# Patient Record
Sex: Male | Born: 1985
Health system: Southern US, Community
[De-identification: ages and names within clinical notes are randomized; demographics above are authoritative.]

---

## 2012-01-20 ENCOUNTER — Telehealth: Payer: Self-pay | Admitting: Pulmonary Disease

## 2012-01-20 NOTE — Telephone Encounter (Signed)
Per SN---yes.  thanks

## 2012-01-20 NOTE — Telephone Encounter (Signed)
Please advise if SN wants to accept this pt or not. Thanks!

## 2012-01-20 NOTE — Telephone Encounter (Signed)
Per SN---which Ms. Zachary Bond?   Yes ok to schedule pt for CPX for the next avaliable.  thanks

## 2012-01-20 NOTE — Telephone Encounter (Signed)
[email or web address]. SN next available August 27th.Charnise Lovan Yancey [place], [place]

## 2012-01-20 NOTE — Telephone Encounter (Signed)
Sorry, Zachary Bond. Still ok?

## 2012-01-21 NOTE — Telephone Encounter (Signed)
Zachary Bond RETURNED CALL. [name] IS SCHEDULED FOR PHYSICAL ON 03-09-12. NOTHING FURTHER NEEDED. [name]

## 2012-01-21 NOTE — Telephone Encounter (Signed)
[email or web address]  

## 2012-03-09 ENCOUNTER — Ambulatory Visit (INDEPENDENT_AMBULATORY_CARE_PROVIDER_SITE_OTHER): Payer: 59 | Admitting: Pulmonary Disease

## 2012-03-09 ENCOUNTER — Ambulatory Visit (INDEPENDENT_AMBULATORY_CARE_PROVIDER_SITE_OTHER)
Admission: RE | Admit: 2012-03-09 | Discharge: 2012-03-09 | Disposition: A | Discharge status: Home / Self Care | Payer: 59 | Source: Ambulatory Visit | Attending: Pulmonary Disease | Admitting: Pulmonary Disease

## 2012-03-09 ENCOUNTER — Encounter: Payer: Self-pay | Admitting: Pulmonary Disease

## 2012-03-09 ENCOUNTER — Other Ambulatory Visit (INDEPENDENT_AMBULATORY_CARE_PROVIDER_SITE_OTHER): Payer: 59

## 2012-03-09 VITALS — BP 110/84 | HR 78 | Temp 98.2°F | Ht 64.0 in | Wt 188.0 lb

## 2012-03-09 DIAGNOSIS — Z23 Encounter for immunization: Secondary | ICD-10-CM

## 2012-03-09 DIAGNOSIS — Z0000 Encounter for general adult medical examination without abnormal findings: Secondary | ICD-10-CM

## 2012-03-09 LAB — HEPATIC FUNCTION PANEL
AST: 24 U/L (ref 0–37)
Alkaline Phosphatase: 42 U/L (ref 39–117)
Bilirubin, Direct: 0.2 mg/dL (ref 0.0–0.3)
Total Bilirubin: 1.2 mg/dL (ref 0.3–1.2)

## 2012-03-09 LAB — URINALYSIS
Ketones, ur: NEGATIVE
Specific Gravity, Urine: 1.015 (ref 1.000–1.030)
Total Protein, Urine: NEGATIVE
Urine Glucose: NEGATIVE
pH: 7 (ref 5.0–8.0)

## 2012-03-09 LAB — BASIC METABOLIC PANEL
CO2: 27 mEq/L (ref 19–32)
Chloride: 105 mEq/L (ref 96–112)
Glucose, Bld: 73 mg/dL (ref 70–99)
Sodium: 139 mEq/L (ref 135–145)

## 2012-03-09 LAB — LIPID PANEL: Total CHOL/HDL Ratio: 3

## 2012-03-09 NOTE — Patient Instructions (Addendum)
Zachary Bond, it was great meeting you today...  Your medical history is certainly negative, and your exam was essentially normal...  Today we did a baseline CXR, EKG, & FASTING blood work...    We will call you tomorrow w/ the results...  Call anytime for any questions or concerns...    Assuming that your baseline data is also normal, I would suggest a recheck physical in 2-3 yrs.Marland KitchenMarland Kitchen

## 2012-03-09 NOTE — Progress Notes (Signed)
Subjective:     Patient ID: Zachary Bond, male   DOB: 04/11/1986, 26 y.o.   MRN: [id number]  HPI 26 y/o BM, fiancee of Zachary Bond (grand daughter of Zachary Bond), here to establish as a new pt w/ CPX...  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          PROBLEM LIST:   DYSLIPIDEMIA >> Initial labs here 9/13 showed LDL= 120 7 rec for low chol diet... ~  FLP 9/13 on diet alone showed TChol 186, TG 51, HDL 56, LDL 120   No past medical history on file.  No past surgical history on file.  No outpatient encounter prescriptions on file as of 03/09/2012.   No Known Allergies  No family history on file. Father- Alive, age 73, hx HBP Mother- Alive, age 42, good general health Siblings- one sister, younger, good general health   History   Social History  . Marital Status: Engaged    Spouse Name: Zachary Bond    Number of Children: Zachary has 2 children  . Years of Education: Grad [employer] w/ Marketing degree   Occupational History  . Solicitor    Social History Main Topics  . Smoking status: Former Games developer  . Smokeless tobacco: Not on file   Comment: smoked a few times  . Alcohol Use: Yes     social use  . Drug Use: Not on file  . Sexually Active: Not on file   Other Topics Concern  . Not on file   Social History Narrative  . No narrative on file     Current Medications, Allergies, Past Medical History, Past Surgical History, Family History, and Social History were reviewed in [facility] record.   Review of  Systems    Constitutional:  Denies F/C/S, anorexia, unexpected weight change. HEENT:  No HA, visual changes, earache, nasal symptoms, sore throat, hoarseness. Resp:  No cough, sputum, hemoptysis; no SOB, tightness, wheezing. Cardio:  No CP, palpit, DOE, orthopnea, edema. GI:  Denies N/V/D/C or blood in stool; no reflux, abd pain, distention, or gas. GU:  No dysuria, freq, urgency, hematuria, or flank pain. MS:  Denies joint pain, swelling, tenderness, or decr ROM; no neck pain, back pain, etc. Neuro:  No tremors, seizures, dizziness, syncope, weakness, numbness, gait abn. Skin:  No suspicious lesions or skin rash. Heme:  No adenopathy, bruising, bleeding. Psyche: Denies confusion, sleep disturbance, hallucinations, anxiety, depression.   Objective:   Physical Exam    Vital Signs:  Reviewed...  General:  WD, WN, 26 y/o BM in NAD; alert & oriented; pleasant & cooperative... HEENT:  [place]/AT; Conjunctiva- pink, Sclera- nonicteric, EOM-wnl, PERRLA, Fundi-benign; EACs-clear, TMs-wnl; NOSE-clear; THROAT-clear & wnl. Neck:  Supple w/ full ROM; no JVD; normal carotid impulses w/o bruits; no thyromegaly or nodules palpated; no lymphadenopathy. Chest:  Clear to P & A; without wheezes, rales, or rhonchi heard. Heart:  Regular Rhythm; norm S1 & S2 without murmurs, rubs, or gallops detected. Abdomen:  Soft & nontender- no guarding or  rebound; normal bowel sounds; no organomegaly or masses palpated. Ext:  Normal ROM; without deformities or arthritic changes; no varicose veins, venous insuffic, or edema;  Pulses intact w/o bruits. Neuro:  CNs II-XII intact; motor testing normal; sensory testing normal; gait normal & balance OK. Derm:  No lesions noted; no rash etc. Lymph:  No cervical, supraclavicular, axillary, or inguinal adenopathy palpated.  RADIOLOGY DATA:  Reviewed in the EPIC EMR & discussed w/ the patient...  LABORATORY DATA:  Reviewed in the EPIC EMR & discussed w/ the  patient...   Assessment:     CPX >>  Godd general medical health...  Dyslipidemia>  LDL=120 & rec to get on better low chol/ low fat diet...     Plan:     Patient's Medications   No medications on file

## 2013-05-24 ENCOUNTER — Encounter: Payer: 59 | Admitting: Pulmonary Disease

## 2013-06-14 ENCOUNTER — Ambulatory Visit (INDEPENDENT_AMBULATORY_CARE_PROVIDER_SITE_OTHER): Payer: 59 | Admitting: Pulmonary Disease

## 2013-06-14 ENCOUNTER — Encounter: Payer: Self-pay | Admitting: Pulmonary Disease

## 2013-06-14 ENCOUNTER — Other Ambulatory Visit (INDEPENDENT_AMBULATORY_CARE_PROVIDER_SITE_OTHER): Payer: 59

## 2013-06-14 VITALS — BP 110/76 | HR 82 | Temp 98.6°F | Ht 73.0 in | Wt 191.6 lb

## 2013-06-14 DIAGNOSIS — K1370 Unspecified lesions of oral mucosa: Secondary | ICD-10-CM

## 2013-06-14 DIAGNOSIS — Z0000 Encounter for general adult medical examination without abnormal findings: Secondary | ICD-10-CM

## 2013-06-14 DIAGNOSIS — K1379 Other lesions of oral mucosa: Secondary | ICD-10-CM

## 2013-06-14 DIAGNOSIS — Z23 Encounter for immunization: Secondary | ICD-10-CM

## 2013-06-14 DIAGNOSIS — Z3009 Encounter for other general counseling and advice on contraception: Secondary | ICD-10-CM

## 2013-06-14 LAB — CBC WITH DIFFERENTIAL/PLATELET
Basophils Absolute: 0 10*3/uL (ref 0.0–0.1)
Basophils Relative: 0.3 % (ref 0.0–3.0)
Eosinophils Absolute: 0.5 10*3/uL (ref 0.0–0.7)
Hemoglobin: 16.2 g/dL (ref 13.0–17.0)
Lymphocytes Relative: 19.9 % (ref 12.0–46.0)
MCHC: 33.6 g/dL (ref 30.0–36.0)
Monocytes Relative: 8.5 % (ref 3.0–12.0)
Neutro Abs: 4.1 10*3/uL (ref 1.4–7.7)
Neutrophils Relative %: 64.1 % (ref 43.0–77.0)
Platelets: 350 10*3/uL (ref 150.0–400.0)
RDW: 14.2 % (ref 11.5–14.6)
WBC: 6.5 10*3/uL (ref 4.5–10.5)

## 2013-06-14 LAB — BASIC METABOLIC PANEL
BUN: 17 mg/dL (ref 6–23)
CO2: 27 mEq/L (ref 19–32)
Calcium: 9.6 mg/dL (ref 8.4–10.5)
Chloride: 104 mEq/L (ref 96–112)
Creatinine, Ser: 1.3 mg/dL (ref 0.4–1.5)
Glucose, Bld: 86 mg/dL (ref 70–99)
Sodium: 139 mEq/L (ref 135–145)

## 2013-06-14 LAB — TSH: TSH: 0.83 u[IU]/mL (ref 0.35–5.50)

## 2013-06-14 LAB — LIPID PANEL
HDL: 52.2 mg/dL (ref >39.00)
Triglycerides: 55 mg/dL (ref 0.0–149.0)
VLDL: 11 mg/dL (ref 0.0–40.0)

## 2013-06-14 LAB — HEPATIC FUNCTION PANEL
ALT: 18 U/L (ref 0–53)
Albumin: 4.7 g/dL (ref 3.5–5.2)
Alkaline Phosphatase: 50 U/L (ref 39–117)
Bilirubin, Direct: 0.1 mg/dL (ref 0.0–0.3)
Total Protein: 8.7 g/dL — ABNORMAL HIGH (ref 6.0–8.3)

## 2013-06-14 NOTE — Patient Instructions (Signed)
Today we updated your med list in our EPIC system...     Today we did your follow up FASTING blood work...    We will contact you w/ the results when available...   We gave you the 2014 Flu vaccine...  We will arrange for ORAL SURGERY consult regarding your wisdom teeth...    And a UROLOGY consult regarding a vasectomy...  Call for any questions...  Let's plan a follow up visit in 16yr, sooner if needed for problems.Marland KitchenMarland Kitchen

## 2013-06-14 NOTE — Progress Notes (Signed)
Subjective:     Patient ID: Zachary Bond, male   DOB: 1986/04/19, 27 y.o.   MRN: [id number]  HPI 27 y/o BM, fiancee of Allice Jones (grand daughter of Casimer Lanius), here to establish as a new pt w/ CPX...  ~  March 09, 2012:  New pt CPX> Zachary Bond has enjoyed excellent general medical health & has no complaints or concerns;  Zachary Bond was an athlete in HS- football & track (ran the 156m in 9.8);  No signif past medical illnesses other than minor colds/ URIs etc;  No prev surgeries either;  We discussed establishing a good baseline w/ CXR (normal heart size, clear lungs, NAD);  EKG (NSR, rate69, short PR=118, early repol);  FASTING blood work (all wnl x LDL=120)...  ~  June 14, 2013:  64mo ROV & CPX> Zachary Bond & Allice have 3 children (2boys, 1 girl) & he is asking about poss vasectomy- we will refer to Urology... Zachary Bond's CC today is some discomfort in his mouth left side lower jaw & exam shows a poorly erupted 3rd molar, sl tender on palp & we will refer to Dentist/ oral surg for their eval, XRays & prob surg...      He is not on any regular meds, trying to follow low fat diet, but wt is up 4# to 192# (BMI=33) and we reviewed diet, exercise, wt reduction strategies... We reviewed prob list, meds, xrays and labs> see below for updates >> OK Flu vaccine today... LABS 12/14:  FLP- ok on diet x LDL=135;  Chems- wnl;  CBC- wnl;  TSH=0.83...            PROBLEM LIST:   DYSLIPIDEMIA >> Initial labs here 9/13 showed LDL= 120 & rec for low chol diet... ~  FLP 9/13 on diet alone showed TChol 186, TG 51, HDL 56, LDL 120 ~  FLP 12/14 on diet alone showed TChol 198, TG 55, HDL 52, LDL 135... Needs better low chol/ low fat diet...   No past medical history on file.  No past surgical history on file.  No outpatient encounter prescriptions on file as of 06/14/2013.   No Known Allergies  No family history on file. Father- Alive, age 80, hx HBP Mother- Alive, age 53, good general health Siblings- one  sister, younger, good general health   History   Social History  . Marital Status: Married    Spouse Name: Shiloh Lions    Number of Children:  3  . Years of Education: Grad [employer] w/ Marketing degree   Occupational History  . Solicitor    Social History Main Topics  . Smoking status: Former Games developer  . Smokeless tobacco: Not on file   Comment: smoked a few times  . Alcohol Use: Yes     social use  . Drug Use: Not on file  . Sexually Active: Not on file   Other Topics Concern  . Not on file   Social History Narrative  . No narrative on file     Current Medications, Allergies, Past Medical History, Past Surgical History, Family History, and Social History were reviewed in [facility] record.   Review of Systems    Constitutional:  Denies F/C/S, anorexia, unexpected weight change. HEENT:  No HA, visual changes, earache, nasal symptoms, sore throat, hoarseness. Resp:  No cough, sputum, hemoptysis; no SOB, tightness, wheezing. Cardio:  No CP, palpit, DOE, orthopnea, edema. GI:  Denies N/V/D/C or blood in stool; no reflux, abd pain,  distention, or gas. GU:  No dysuria, freq, urgency, hematuria, or flank pain. MS:  Denies joint pain, swelling, tenderness, or decr ROM; no neck pain, back pain, etc. Neuro:  No tremors, seizures, dizziness, syncope, weakness, numbness, gait abn. Skin:  No suspicious lesions or skin rash. Heme:  No adenopathy, bruising, bleeding. Psyche: Denies confusion, sleep disturbance, hallucinations, anxiety, depression.   Objective:   Physical Exam    Vital Signs:  Reviewed...  General:  WD, WN, 27 y/o BM in NAD; alert & oriented; pleasant & cooperative... HEENT:  /AT; Conjunctiva- pink, Sclera- nonicteric, EOM-wnl, PERRLA, Fundi-benign; EACs-clear, TMs-wnl; NOSE-clear; THROAT-clear & wnl. Neck:  Supple w/ full ROM; no JVD; normal carotid impulses w/o bruits; no thyromegaly or nodules palpated; no  lymphadenopathy. Chest:  Clear to P & A; without wheezes, rales, or rhonchi heard. Heart:  Regular Rhythm; norm S1 & S2 without murmurs, rubs, or gallops detected. Abdomen:  Soft & nontender- no guarding or rebound; normal bowel sounds; no organomegaly or masses palpated. Ext:  Normal ROM; without deformities or arthritic changes; no varicose veins, venous insuffic, or edema;  Pulses intact w/o bruits. Neuro:  CNs II-XII intact; motor testing normal; sensory testing normal; gait normal & balance OK. Derm:  No lesions noted; no rash etc. Lymph:  No cervical, supraclavicular, axillary, or inguinal adenopathy palpated.  RADIOLOGY DATA:  Reviewed in the EPIC EMR & discussed w/ the patient...  LABORATORY DATA:  Reviewed in the EPIC EMR & discussed w/ the patient...   Assessment:     CPX >>  Godd general medical health...  ?Impacted wisdom teeth> needs oral surg eval & we will refer...  Dyslipidemia>  LDL=135 & rec to get on better low chol/ low fat diet...  Pt requests Vasectomy> we will refer to Urology...     Plan:     Patient's Medications   No medications on file

## 2013-06-15 LAB — STD PANEL
HIV: NONREACTIVE
Hepatitis B Surface Ag: NEGATIVE

## 2013-07-12 ENCOUNTER — Telehealth: Payer: Self-pay | Admitting: Pulmonary Disease

## 2013-07-12 NOTE — Telephone Encounter (Signed)
Spoke with pt and informed of lab results per Dr Kriste BasqueNadel

## 2014-06-13 ENCOUNTER — Ambulatory Visit: Payer: 59 | Admitting: Pulmonary Disease

## 2015-01-22 ENCOUNTER — Telehealth: Payer: Self-pay | Admitting: Behavioral Health

## 2015-01-22 NOTE — Telephone Encounter (Signed)
Unable to reach patient at time of Pre-Visit Call.  Left message for patient to return call when available.    

## 2015-01-23 ENCOUNTER — Ambulatory Visit: Payer: Self-pay | Admitting: Family Medicine

## 2015-01-23 DIAGNOSIS — Z0289 Encounter for other administrative examinations: Secondary | ICD-10-CM

## 2015-01-24 ENCOUNTER — Telehealth: Payer: Self-pay | Admitting: Pulmonary Disease

## 2015-01-24 ENCOUNTER — Encounter: Payer: Self-pay | Admitting: Pulmonary Disease

## 2015-01-24 NOTE — Telephone Encounter (Signed)
Patient no show new patient appointment 01/23/15 letter mailed, charge or no charge

## 2015-01-25 NOTE — Telephone Encounter (Signed)
charge 

## 2015-10-28 ENCOUNTER — Telehealth: Payer: Self-pay | Admitting: Internal Medicine

## 2015-10-28 NOTE — Telephone Encounter (Signed)
4.25.17 Pt's mother in law Richardo Hanks(Janice Jones) came in to ask if Dr. Posey ReaPlotnikov would take Casimiro NeedleMichael on as a new pt. MS

## 2015-10-30 NOTE — Telephone Encounter (Signed)
Ok Thx 

## 2015-12-11 ENCOUNTER — Other Ambulatory Visit (INDEPENDENT_AMBULATORY_CARE_PROVIDER_SITE_OTHER): Payer: Managed Care, Other (non HMO)

## 2015-12-11 ENCOUNTER — Ambulatory Visit (INDEPENDENT_AMBULATORY_CARE_PROVIDER_SITE_OTHER): Payer: Managed Care, Other (non HMO) | Admitting: Internal Medicine

## 2015-12-11 ENCOUNTER — Encounter: Payer: Self-pay | Admitting: Internal Medicine

## 2015-12-11 VITALS — BP 120/70 | HR 88 | Ht 73.0 in | Wt 203.0 lb

## 2015-12-11 DIAGNOSIS — Z0000 Encounter for general adult medical examination without abnormal findings: Secondary | ICD-10-CM

## 2015-12-11 DIAGNOSIS — R21 Rash and other nonspecific skin eruption: Secondary | ICD-10-CM

## 2015-12-11 DIAGNOSIS — R519 Headache, unspecified: Secondary | ICD-10-CM

## 2015-12-11 DIAGNOSIS — Z23 Encounter for immunization: Secondary | ICD-10-CM

## 2015-12-11 DIAGNOSIS — R51 Headache: Secondary | ICD-10-CM

## 2015-12-11 LAB — CBC WITH DIFFERENTIAL/PLATELET
BASOS ABS: 0 10*3/uL (ref 0.0–0.1)
Basophils Relative: 0.4 % (ref 0.0–3.0)
EOS ABS: 0.5 10*3/uL (ref 0.0–0.7)
Eosinophils Relative: 6.4 % — ABNORMAL HIGH (ref 0.0–5.0)
HCT: 48.3 % (ref 39.0–52.0)
Hemoglobin: 16 g/dL (ref 13.0–17.0)
LYMPHS ABS: 1.5 10*3/uL (ref 0.7–4.0)
Lymphocytes Relative: 20.7 % (ref 12.0–46.0)
MCHC: 33.1 g/dL (ref 30.0–36.0)
MCV: 79.9 fl (ref 78.0–100.0)
MONO ABS: 0.8 10*3/uL (ref 0.1–1.0)
Monocytes Relative: 10.6 % (ref 3.0–12.0)
NEUTROS PCT: 61.9 % (ref 43.0–77.0)
Neutro Abs: 4.5 10*3/uL (ref 1.4–7.7)
Platelets: 374 10*3/uL (ref 150.0–400.0)
RBC: 6.04 Mil/uL — AB (ref 4.22–5.81)
RDW: 14.9 % (ref 11.5–15.5)
WBC: 7.3 10*3/uL (ref 4.0–10.5)

## 2015-12-11 LAB — URINALYSIS
BILIRUBIN URINE: NEGATIVE
Hgb urine dipstick: NEGATIVE
KETONES UR: NEGATIVE
Leukocytes, UA: NEGATIVE
Nitrite: NEGATIVE
PH: 7.5 (ref 5.0–8.0)
SPECIFIC GRAVITY, URINE: 1.01 (ref 1.000–1.030)
Total Protein, Urine: NEGATIVE
URINE GLUCOSE: NEGATIVE
Urobilinogen, UA: 0.2 (ref 0.0–1.0)

## 2015-12-11 LAB — LIPID PANEL
CHOL/HDL RATIO: 4
Cholesterol: 186 mg/dL (ref 0–200)
HDL: 47.9 mg/dL (ref >39.00)
LDL Cholesterol: 126 mg/dL — ABNORMAL HIGH (ref 0–99)
NONHDL: 138.59
Triglycerides: 64 mg/dL (ref 0.0–149.0)
VLDL: 12.8 mg/dL (ref 0.0–40.0)

## 2015-12-11 LAB — HEPATIC FUNCTION PANEL
ALK PHOS: 50 U/L (ref 39–117)
ALT: 17 U/L (ref 0–53)
AST: 17 U/L (ref 0–37)
Albumin: 4.4 g/dL (ref 3.5–5.2)
BILIRUBIN DIRECT: 0.2 mg/dL (ref 0.0–0.3)
BILIRUBIN TOTAL: 1.3 mg/dL — AB (ref 0.2–1.2)
Total Protein: 8 g/dL (ref 6.0–8.3)

## 2015-12-11 LAB — BASIC METABOLIC PANEL
BUN: 13 mg/dL (ref 6–23)
CALCIUM: 9.6 mg/dL (ref 8.4–10.5)
CO2: 28 mEq/L (ref 19–32)
CREATININE: 1.19 mg/dL (ref 0.40–1.50)
Chloride: 104 mEq/L (ref 96–112)
GFR: 92.04 mL/min (ref >60.00)
GLUCOSE: 83 mg/dL (ref 70–99)
Potassium: 4.2 mEq/L (ref 3.5–5.1)
Sodium: 138 mEq/L (ref 135–145)

## 2015-12-11 LAB — TSH: TSH: 0.94 u[IU]/mL (ref 0.35–4.50)

## 2015-12-11 MED ORDER — TRIAMCINOLONE ACETONIDE 0.5 % EX OINT
1.0000 | TOPICAL_OINTMENT | Freq: Two times a day (BID) | CUTANEOUS | Status: DC
Start: 2015-12-11 — End: 2017-01-12

## 2015-12-11 MED ORDER — VITAMIN D 1000 UNITS PO TABS: 1000.0000 [IU] | ORAL_TABLET | Freq: Every day | ORAL | Status: AC

## 2015-12-11 NOTE — Progress Notes (Signed)
Subjective:  Patient ID: Zachary Bond, male    DOB: January 19, 1986  Age: 30 y.o. MRN: [id number]  CC: Establish Care   HPI Zachary ManMichael Bond presents for well exam.C/o occ HAs over L eye w/nausea - relieved w/Tylenol. C/o rash in epig area  No outpatient prescriptions prior to visit.   No facility-administered medications prior to visit.    ROS Review of Systems  Constitutional: Negative for appetite change, fatigue and unexpected weight change.  HENT: Negative for congestion, nosebleeds, sneezing, sore throat and trouble swallowing.   Eyes: Negative for itching and visual disturbance.  Respiratory: Negative for cough.   Cardiovascular: Negative for chest pain, palpitations and leg swelling.  Gastrointestinal: Negative for nausea, diarrhea, blood in stool and abdominal distention.  Genitourinary: Negative for frequency and hematuria.  Musculoskeletal: Negative for back pain, joint swelling, gait problem and neck pain.  Skin: Positive for rash.  Neurological: Positive for headaches. Negative for dizziness, tremors, speech difficulty and weakness.  Psychiatric/Behavioral: Negative for suicidal ideas, sleep disturbance, dysphoric mood and agitation. The patient is not nervous/anxious.     Objective:  BP 120/70 mmHg  Pulse 88  Ht 6\' 1"  (1.854 m)  Wt 203 lb (92.08 kg)  BMI 26.79 kg/m2  SpO2 97%  BP Readings from Last 3 Encounters:  12/11/15 120/70  06/14/13 110/76  03/09/12 110/84    Wt Readings from Last 3 Encounters:  12/11/15 203 lb (92.08 kg)  06/14/13 191 lb 9.6 oz (86.909 kg)  03/09/12 188 lb (85.276 kg)    Physical Exam  Constitutional: He is oriented to person, place, and time. He appears well-developed. No distress.  NAD  HENT:  Mouth/Throat: Oropharynx is clear and moist.  Eyes: Conjunctivae are normal. Pupils are equal, round, and reactive to light.  Neck: Normal range of motion. No JVD present. No thyromegaly present.  Cardiovascular: Normal rate, regular  rhythm, normal heart sounds and intact distal pulses.  Exam reveals no gallop and no friction rub.   No murmur heard. Pulmonary/Chest: Effort normal and breath sounds normal. No respiratory distress. He has no wheezes. He has no rales. He exhibits no tenderness.  Abdominal: Soft. Bowel sounds are normal. He exhibits no distension and no mass. There is no tenderness. There is no rebound and no guarding.  Musculoskeletal: Normal range of motion. He exhibits no edema or tenderness.  Lymphadenopathy:    He has no cervical adenopathy.  Neurological: He is alert and oriented to person, place, and time. He has normal reflexes. No cranial nerve deficit. He exhibits normal muscle tone. He displays a negative Romberg sign. Coordination and gait normal.  Skin: Skin is warm and dry. Rash noted.  Psychiatric: He has a normal mood and affect. His behavior is normal. Judgment and thought content normal.  rough skin over epigastric area 8 cm  Lab Results  Component Value Date   WBC 6.5 06/14/2013   HGB 16.2 06/14/2013   HCT 48.2 06/14/2013   PLT 350.0 06/14/2013   GLUCOSE 86 06/14/2013   CHOL 198 06/14/2013   TRIG 55.0 06/14/2013   HDL 52.20 06/14/2013   LDLCALC 135* 06/14/2013   ALT 18 06/14/2013   AST 21 06/14/2013   NA 139 06/14/2013   K 4.6 06/14/2013   CL 104 06/14/2013   CREATININE 1.3 06/14/2013   BUN 17 06/14/2013   CO2 27 06/14/2013   TSH 0.83 06/14/2013    Dg Chest 2 View  03/09/2012  *RADIOLOGY REPORT* Clinical Data: Physical exam CHEST - 2 VIEW Comparison:  None Findings: The heart size and mediastinal contours are within normal limits.  Both lungs are clear.  The visualized skeletal structures are unremarkable. IMPRESSION: No acute cardiopulmonary abnormalities. Original Report Authenticated By: Rosealee Albee, M.D.    Assessment & Plan:   There are no diagnoses linked to this encounter. Zachary Bond does not currently have medications on file.  No orders of the defined types  were placed in this encounter.     Follow-up: No Follow-up on file.  Sonda Primes, MD

## 2015-12-11 NOTE — Progress Notes (Signed)
Pre visit review using our clinic review tool, if applicable. No additional management support is needed unless otherwise documented below in the visit note. 

## 2015-12-11 NOTE — Assessment & Plan Note (Addendum)
We discussed age appropriate health related issues, including available/recomended screening tests and vaccinations. We discussed a need for adhering to healthy diet and exercise. Labs/EKG were reviewed/ordered. All questions were answered.  Labs tDAP Stress management discussed

## 2016-07-07 ENCOUNTER — Emergency Department (HOSPITAL_BASED_OUTPATIENT_CLINIC_OR_DEPARTMENT_OTHER)
Admission: EM | Admit: 2016-07-07 | Discharge: 2016-07-07 | Disposition: A | Discharge status: Home / Self Care | Payer: Managed Care, Other (non HMO) | Attending: Emergency Medicine | Admitting: Emergency Medicine

## 2016-07-07 ENCOUNTER — Encounter (HOSPITAL_BASED_OUTPATIENT_CLINIC_OR_DEPARTMENT_OTHER): Payer: Self-pay | Admitting: Emergency Medicine

## 2016-07-07 DIAGNOSIS — R002 Palpitations: Secondary | ICD-10-CM | POA: Diagnosis not present

## 2016-07-07 DIAGNOSIS — R55 Syncope and collapse: Secondary | ICD-10-CM | POA: Diagnosis not present

## 2016-07-07 DIAGNOSIS — Z87891 Personal history of nicotine dependence: Secondary | ICD-10-CM | POA: Insufficient documentation

## 2016-07-07 LAB — CBC
HCT: 48.1 % (ref 39.0–52.0)
HEMOGLOBIN: 16 g/dL (ref 13.0–17.0)
MCH: 26.5 pg (ref 26.0–34.0)
MCHC: 33.3 g/dL (ref 30.0–36.0)
MCV: 79.6 fL (ref 78.0–100.0)
PLATELETS: 314 10*3/uL (ref 150–400)
RBC: 6.04 MIL/uL — AB (ref 4.22–5.81)
RDW: 14.6 % (ref 11.5–15.5)
WBC: 8.9 10*3/uL (ref 4.0–10.5)

## 2016-07-07 LAB — URINALYSIS, ROUTINE W REFLEX MICROSCOPIC
Bilirubin Urine: NEGATIVE
GLUCOSE, UA: NEGATIVE mg/dL
HGB URINE DIPSTICK: NEGATIVE
Ketones, ur: NEGATIVE mg/dL
LEUKOCYTES UA: NEGATIVE
Nitrite: NEGATIVE
PROTEIN: NEGATIVE mg/dL
SPECIFIC GRAVITY, URINE: 1.004 — AB (ref 1.005–1.030)
pH: 6.5 (ref 5.0–8.0)

## 2016-07-07 LAB — BASIC METABOLIC PANEL
ANION GAP: 10 (ref 5–15)
BUN: 10 mg/dL (ref 6–20)
CHLORIDE: 104 mmol/L (ref 101–111)
CO2: 23 mmol/L (ref 22–32)
CREATININE: 1.13 mg/dL (ref 0.61–1.24)
Calcium: 9.3 mg/dL (ref 8.9–10.3)
GFR calc non Af Amer: >60 mL/min (ref >60)
Glucose, Bld: 94 mg/dL (ref 65–99)
POTASSIUM: 3.7 mmol/L (ref 3.5–5.1)
SODIUM: 137 mmol/L (ref 135–145)

## 2016-07-07 LAB — CBG MONITORING, ED: GLUCOSE-CAPILLARY: 80 mg/dL (ref 65–99)

## 2016-07-07 NOTE — Discharge Instructions (Signed)
Drink plenty of fluids and get plenty of rest.  Return to the emergency department if your symptoms recur, worsen, or change.

## 2016-07-07 NOTE — ED Provider Notes (Signed)
[facility] Provider Note   CSN: [id number] Arrival date & time: 07/07/16  1804  By signing my name below, I, [name], attest that this documentation has been prepared under the direction and in the presence of physician practitioner, [name], MD. Electronically Signed: [name], Scribe. 07/07/2016. 6:47 PM.  History   Chief Complaint Chief Complaint  Patient presents with  . Loss of Consciousness    The history is provided by the patient. No language interpreter was used.     HPI Comments: Zachary Bond is a 31 y.o. male who presents to the Emergency Department complaining of a brief syncopal episode that occurred around 5 PM this evening. 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 He states he fell on the floor but notes no muscular or joint pain. He reports he had palpitations prior to losing consciousness and notes he had been anxious about delivering the presentation for a few days. No h/o the same in the past. He states he feels normal currently. He does not use any regular medications. He reports he has been working a lot lately and has not been getting adequate rest. He denies dizziness, headache, numbness, weakness, seizure-like activity, tremors, bowel/bladder incontinence, or any other associated symptoms.  History reviewed. No pertinent past medical history.  Patient Active Problem List   Diagnosis Date Noted  . Headache 12/11/2015  . Rash and nonspecific skin eruption 12/11/2015  . Physical exam, annual 03/09/2012    History reviewed. No pertinent surgical history.     Home Medications    Prior to Admission medications   Medication Sig Start Date End Date Taking? Authorizing Provider  cholecalciferol (VITAMIN D) 1000 units tablet Take 1 tablet (1,000 Units total) by mouth daily. 12/11/15 12/10/16  Georgina Quint Plotnikov, MD    triamcinolone ointment (KENALOG) 0.5 % Apply 1 application topically 2 (two) times daily. 12/11/15   Tresa Garter, MD    Family History History reviewed. No pertinent family history.  Social History Social History  Substance Use Topics  . Smoking status: Former Smoker    Types: Cigars  . Smokeless tobacco: Never Used     Comment: smoked a few times  . Alcohol use 0.0 oz/week     Comment: social use     Allergies   Patient has no known allergies.   Review of Systems Review of Systems  Cardiovascular: Positive for palpitations (resolved).  Gastrointestinal:       Negative for bowel incontinence.  Genitourinary:       Negative for urinary incontinence.  Musculoskeletal: Negative for arthralgias and myalgias.  Neurological: Positive for syncope and light-headedness (resolved). Negative for dizziness, tremors, seizures, weakness, numbness and headaches.  Psychiatric/Behavioral: The patient is nervous/anxious (resolved).      Physical Exam Updated Vital Signs BP 149/100 (BP Location: Left Arm)   Pulse 72   Temp 98 F (36.7 C) (Oral)   Resp 18   Ht 6\' 2"  (1.88 m)   Wt 200 lb (90.7 kg)   SpO2 100%   BMI 25.68 kg/m   Physical Exam  Constitutional: He is oriented to person, place, and time. He appears well-developed and well-nourished.  HENT:  Head: Normocephalic and atraumatic.  Eyes: EOM are normal.  Neck: Normal range of motion.  Cardiovascular: Normal rate, regular rhythm, normal heart sounds and intact distal pulses.   Pulmonary/Chest: Effort normal and breath sounds normal.  No respiratory distress.  Abdominal: Soft. He exhibits no distension. There is no tenderness.  Musculoskeletal: Normal range of motion.  Neurological: He is alert and oriented to person, place, and time.  Skin: Skin is warm and dry.  Psychiatric: He has a normal mood and affect. Judgment normal.  Nursing note and vitals reviewed.    ED Treatments / Results  Labs (all labs  ordered are listed, but only abnormal results are displayed) Labs Reviewed  BASIC METABOLIC PANEL  CBC  URINALYSIS, ROUTINE W REFLEX MICROSCOPIC  CBG MONITORING, ED    EKG  EKG Interpretation  Date/Time:  Wednesday July 07 2016 18:15:04 EST Ventricular Rate:  74 PR Interval:  134 QRS Duration: 76 QT Interval:  368 QTC Calculation: 408 R Axis:   45 Text Interpretation:  Normal sinus rhythm Nonspecific ST and T wave abnormality Abnormal ECG Confirmed by Britiney Blahnik  MD, Raegan Winders ([id number]) on 07/07/2016 10:09:15 PM       Radiology No results found.  Procedures Procedures (including critical care time)  DIAGNOSTIC STUDIES: Oxygen Saturation is 100% on RA, normal by my interpretation.    COORDINATION OF CARE: 6:54 PM Discussed treatment plan with pt at bedside and pt agreed to plan.  Medications Ordered in ED Medications - No data to display   Initial Impression / Assessment and Plan / ED Course  I have reviewed the triage vital signs and the nursing notes.  Pertinent labs & imaging results that were available during my care of the patient were reviewed by me and considered in my medical decision making (see chart for details).  Clinical Course     Patient presents after a syncopal episode. This occurred while he was given a presentation at work. His EKG shows no acute change and laboratory studies are unremarkable. This episode lasted several seconds, then resolved. There is no reported seizure activity or bowel or bladder incontinence. I highly suspect a vasovagal etiology as the patient has been very anxious about this presentation and has not been eating or sleeping regularly. He will be discharged, to return as needed for any problems.  Final Clinical Impressions(s) / ED Diagnoses   Final diagnoses:  None    New Prescriptions New Prescriptions   No medications on file   I personally performed the services described in this documentation, which was scribed in my  presence. The recorded information has been reviewed and is accurate.       Geoffery Lyonsouglas Levana Minetti, MD 07/07/16 2210

## 2016-07-07 NOTE — ED Triage Notes (Signed)
Pt reports syncopal episode while giving a presentation at work. Denies head injury. EMS assessed patient and asked pt to be seen in an ER.   Wife states patient has been working long hours without adequate rest.

## 2017-01-12 ENCOUNTER — Ambulatory Visit (INDEPENDENT_AMBULATORY_CARE_PROVIDER_SITE_OTHER): Payer: 59 | Admitting: Internal Medicine

## 2017-01-12 ENCOUNTER — Encounter: Payer: Self-pay | Admitting: Internal Medicine

## 2017-01-12 ENCOUNTER — Other Ambulatory Visit (INDEPENDENT_AMBULATORY_CARE_PROVIDER_SITE_OTHER): Payer: 59

## 2017-01-12 VITALS — BP 122/84 | HR 81 | Temp 98.2°F | Ht 74.0 in | Wt 210.0 lb

## 2017-01-12 DIAGNOSIS — F410 Panic disorder [episodic paroxysmal anxiety] without agoraphobia: Secondary | ICD-10-CM

## 2017-01-12 DIAGNOSIS — Z0000 Encounter for general adult medical examination without abnormal findings: Secondary | ICD-10-CM

## 2017-01-12 LAB — URINALYSIS
Bilirubin Urine: NEGATIVE
HGB URINE DIPSTICK: NEGATIVE
KETONES UR: NEGATIVE
LEUKOCYTES UA: NEGATIVE
Nitrite: NEGATIVE
Specific Gravity, Urine: 1.015 (ref 1.000–1.030)
Total Protein, Urine: NEGATIVE
UROBILINOGEN UA: 0.2 (ref 0.0–1.0)
Urine Glucose: NEGATIVE
pH: 7 (ref 5.0–8.0)

## 2017-01-12 LAB — LIPID PANEL
Cholesterol: 211 mg/dL — ABNORMAL HIGH (ref 0–200)
HDL: 52.1 mg/dL (ref >39.00)
LDL Cholesterol: 137 mg/dL — ABNORMAL HIGH (ref 0–99)
NONHDL: 158.68
Total CHOL/HDL Ratio: 4
Triglycerides: 107 mg/dL (ref 0.0–149.0)
VLDL: 21.4 mg/dL (ref 0.0–40.0)

## 2017-01-12 LAB — BASIC METABOLIC PANEL
BUN: 12 mg/dL (ref 6–23)
CALCIUM: 9.6 mg/dL (ref 8.4–10.5)
CO2: 23 meq/L (ref 19–32)
Chloride: 105 mEq/L (ref 96–112)
Creatinine, Ser: 1.29 mg/dL (ref 0.40–1.50)
GFR: 83.26 mL/min (ref >60.00)
Glucose, Bld: 95 mg/dL (ref 70–99)
POTASSIUM: 4 meq/L (ref 3.5–5.1)
SODIUM: 137 meq/L (ref 135–145)

## 2017-01-12 LAB — HEPATIC FUNCTION PANEL
ALBUMIN: 4.4 g/dL (ref 3.5–5.2)
ALK PHOS: 52 U/L (ref 39–117)
ALT: 25 U/L (ref 0–53)
AST: 23 U/L (ref 0–37)
Bilirubin, Direct: 0.1 mg/dL (ref 0.0–0.3)
TOTAL PROTEIN: 8.1 g/dL (ref 6.0–8.3)
Total Bilirubin: 1.1 mg/dL (ref 0.2–1.2)

## 2017-01-12 LAB — CBC WITH DIFFERENTIAL/PLATELET
BASOS ABS: 0.1 10*3/uL (ref 0.0–0.1)
Basophils Relative: 1.1 % (ref 0.0–3.0)
EOS PCT: 4.2 % (ref 0.0–5.0)
Eosinophils Absolute: 0.2 10*3/uL (ref 0.0–0.7)
HEMATOCRIT: 49.2 % (ref 39.0–52.0)
Hemoglobin: 16.3 g/dL (ref 13.0–17.0)
LYMPHS PCT: 20.4 % (ref 12.0–46.0)
Lymphs Abs: 1.2 10*3/uL (ref 0.7–4.0)
MCHC: 33.2 g/dL (ref 30.0–36.0)
MCV: 82.3 fl (ref 78.0–100.0)
MONOS PCT: 11.6 % (ref 3.0–12.0)
Monocytes Absolute: 0.7 10*3/uL (ref 0.1–1.0)
NEUTROS ABS: 3.6 10*3/uL (ref 1.4–7.7)
Neutrophils Relative %: 62.7 % (ref 43.0–77.0)
PLATELETS: 324 10*3/uL (ref 150.0–400.0)
RBC: 5.98 Mil/uL — AB (ref 4.22–5.81)
RDW: 15 % (ref 11.5–15.5)
WBC: 5.7 10*3/uL (ref 4.0–10.5)

## 2017-01-12 LAB — TSH: TSH: 0.76 u[IU]/mL (ref 0.35–4.50)

## 2017-01-12 MED ORDER — VITAMIN D3 50 MCG (2000 UT) PO CAPS: 2000.0000 [IU] | ORAL_CAPSULE | Freq: Every day | ORAL | 3 refills | Status: DC

## 2017-01-12 MED ORDER — LORAZEPAM 0.5 MG PO TABS: 0.5000 mg | ORAL_TABLET | Freq: Every day | ORAL | 0 refills | Status: DC | PRN

## 2017-01-12 NOTE — Assessment & Plan Note (Signed)
He had a vaso-vagal syncope related to anxiety in 1/18 once. Lorazepam prn public speaking anxiety

## 2017-01-12 NOTE — Assessment & Plan Note (Signed)
We discussed age appropriate health related issues, including available/recomended screening tests and vaccinations. We discussed a need for adhering to healthy diet and exercise. Labs were ordered to be later reviewed . All questions were answered.   

## 2017-01-12 NOTE — Progress Notes (Signed)
Subjective:  Patient ID: Zachary Bond, male    DOB: 1986/05/20  Age: 31 y.o. MRN: [id number]  CC: No chief complaint on file.   HPI Zachary Bond presents for a well exam. He had a vaso-vagal syncope related to anxiety in 1/18 once.  Outpatient Medications Prior to Visit  Medication Sig Dispense Refill  . triamcinolone ointment (KENALOG) 0.5 % Apply 1 application topically 2 (two) times daily. 45 g 2   No facility-administered medications prior to visit.     ROS Review of Systems  Constitutional: Negative for appetite change, fatigue and unexpected weight change.  HENT: Negative for congestion, nosebleeds, sneezing, sore throat and trouble swallowing.   Eyes: Negative for itching and visual disturbance.  Respiratory: Negative for cough.   Cardiovascular: Negative for chest pain, palpitations and leg swelling.  Gastrointestinal: Negative for abdominal distention, blood in stool, diarrhea and nausea.  Genitourinary: Negative for frequency and hematuria.  Musculoskeletal: Negative for back pain, gait problem, joint swelling and neck pain.  Skin: Negative for rash.  Neurological: Negative for dizziness, tremors, speech difficulty and weakness.  Psychiatric/Behavioral: Negative for agitation, dysphoric mood, sleep disturbance and suicidal ideas. The patient is not nervous/anxious.     Objective:  BP 122/84 (BP Location: Left Arm, Patient Position: Sitting, Cuff Size: Large)   Pulse 81   Temp 98.2 F (36.8 C) (Oral)   Ht 6\' 2"  (1.88 m)   Wt 210 lb (95.3 kg)   SpO2 99%   BMI 26.96 kg/m   BP Readings from Last 3 Encounters:  01/12/17 122/84  07/07/16 140/82  12/11/15 120/70    Wt Readings from Last 3 Encounters:  01/12/17 210 lb (95.3 kg)  07/07/16 200 lb (90.7 kg)  12/11/15 203 lb (92.1 kg)    Physical Exam  Constitutional: He is oriented to person, place, and time. He appears well-developed. No distress.  NAD  HENT:  Mouth/Throat: Oropharynx is clear and moist.   Eyes: Conjunctivae are normal. Pupils are equal, round, and reactive to light.  Neck: Normal range of motion. No JVD present. No thyromegaly present.  Cardiovascular: Normal rate, regular rhythm, normal heart sounds and intact distal pulses.  Exam reveals no gallop and no friction rub.   No murmur heard. Pulmonary/Chest: Effort normal and breath sounds normal. No respiratory distress. He has no wheezes. He has no rales. He exhibits no tenderness.  Abdominal: Soft. Bowel sounds are normal. He exhibits no distension and no mass. There is no tenderness. There is no rebound and no guarding.  Genitourinary: Penis normal.  Musculoskeletal: Normal range of motion. He exhibits no edema or tenderness.  Lymphadenopathy:    He has no cervical adenopathy.  Neurological: He is alert and oriented to person, place, and time. He has normal reflexes. No cranial nerve deficit. He exhibits normal muscle tone. He displays a negative Romberg sign. Coordination and gait normal.  Skin: Skin is warm and dry. No rash noted.  Psychiatric: He has a normal mood and affect. His behavior is normal. Judgment and thought content normal.  B testes nl  Lab Results  Component Value Date   WBC 8.9 07/07/2016   HGB 16.0 07/07/2016   HCT 48.1 07/07/2016   PLT 314 07/07/2016   GLUCOSE 94 07/07/2016   CHOL 186 12/11/2015   TRIG 64.0 12/11/2015   HDL 47.90 12/11/2015   LDLCALC 126 (H) 12/11/2015   ALT 17 12/11/2015   AST 17 12/11/2015   NA 137 07/07/2016   K 3.7 07/07/2016  CL 104 07/07/2016   CREATININE 1.13 07/07/2016   BUN 10 07/07/2016   CO2 23 07/07/2016   TSH 0.94 12/11/2015    No results found.  Assessment & Plan:   There are no diagnoses linked to this encounter. I have discontinued Zachary Bond's triamcinolone ointment.  No orders of the defined types were placed in this encounter.    Follow-up: No Follow-up on file.  Sonda PrimesAlex Plotnikov, MD

## 2018-01-30 ENCOUNTER — Encounter: Payer: 59 | Admitting: Internal Medicine

## 2018-02-08 ENCOUNTER — Ambulatory Visit (INDEPENDENT_AMBULATORY_CARE_PROVIDER_SITE_OTHER): Payer: 59 | Admitting: Internal Medicine

## 2018-02-08 ENCOUNTER — Encounter: Payer: Self-pay | Admitting: Internal Medicine

## 2018-02-08 VITALS — BP 126/82 | HR 108 | Temp 98.6°F | Ht 74.0 in | Wt 210.0 lb

## 2018-02-08 DIAGNOSIS — Z0000 Encounter for general adult medical examination without abnormal findings: Secondary | ICD-10-CM

## 2018-02-08 DIAGNOSIS — Z3009 Encounter for other general counseling and advice on contraception: Secondary | ICD-10-CM | POA: Diagnosis not present

## 2018-02-08 NOTE — Assessment & Plan Note (Signed)
We discussed age appropriate health related issues, including available/recomended screening tests and vaccinations. We discussed a need for adhering to healthy diet and exercise. Labs were ordered to be later reviewed . All questions were answered. He is interested in vasectomy. Urology referal

## 2018-02-08 NOTE — Progress Notes (Signed)
Subjective:  Patient ID: Zachary Bond, male    DOB: 01/26/1986  Age: 32 y.o. MRN: [id number]  CC: No chief complaint on file.   HPI Zachary Bond presents for a well exam  Outpatient Medications Prior to Visit  Medication Sig Dispense Refill  . Cholecalciferol (VITAMIN D3) 2000 units capsule Take 1 capsule (2,000 Units total) by mouth daily. 100 capsule 3  . LORazepam (ATIVAN) 0.5 MG tablet Take 1 tablet (0.5 mg total) by mouth daily as needed for anxiety. (Patient not taking: Reported on 02/08/2018) 12 tablet 0   No facility-administered medications prior to visit.     ROS: Review of Systems  Constitutional: Positive for unexpected weight change. Negative for appetite change and fatigue.  HENT: Negative for congestion, nosebleeds, sneezing, sore throat and trouble swallowing.   Eyes: Negative for itching and visual disturbance.  Respiratory: Negative for cough.   Cardiovascular: Negative for chest pain, palpitations and leg swelling.  Gastrointestinal: Negative for abdominal distention, blood in stool, diarrhea and nausea.  Genitourinary: Negative for frequency and hematuria.  Musculoskeletal: Negative for back pain, gait problem, joint swelling and neck pain.  Skin: Negative for rash.  Neurological: Negative for dizziness, tremors, speech difficulty and weakness.  Psychiatric/Behavioral: Negative for agitation, dysphoric mood and sleep disturbance. The patient is not nervous/anxious.     Objective:  BP 126/82 (BP Location: Right Arm, Patient Position: Sitting, Cuff Size: Large)   Pulse (!) 108   Temp 98.6 F (37 C) (Oral)   Ht 6\' 2"  (1.88 m)   Wt 210 lb (95.3 kg)   SpO2 95%   BMI 26.96 kg/m   BP Readings from Last 3 Encounters:  02/08/18 126/82  01/12/17 122/84  07/07/16 140/82    Wt Readings from Last 3 Encounters:  02/08/18 210 lb (95.3 kg)  01/12/17 210 lb (95.3 kg)  07/07/16 200 lb (90.7 kg)    Physical Exam  Constitutional: He is oriented to person,  place, and time. He appears well-developed. No distress.  NAD  HENT:  Mouth/Throat: Oropharynx is clear and moist.  Eyes: Pupils are equal, round, and reactive to light. Conjunctivae are normal.  Neck: Normal range of motion. No JVD present. No thyromegaly present.  Cardiovascular: Normal rate, regular rhythm, normal heart sounds and intact distal pulses. Exam reveals no gallop and no friction rub.  No murmur heard. Pulmonary/Chest: Effort normal and breath sounds normal. No respiratory distress. He has no wheezes. He has no rales. He exhibits no tenderness.  Abdominal: Soft. Bowel sounds are normal. He exhibits no distension and no mass. There is no tenderness. There is no rebound and no guarding.  Musculoskeletal: Normal range of motion. He exhibits no edema or tenderness.  Lymphadenopathy:    He has no cervical adenopathy.  Neurological: He is alert and oriented to person, place, and time. He has normal reflexes. No cranial nerve deficit. He exhibits normal muscle tone. He displays a negative Romberg sign. Coordination and gait normal.  Skin: Skin is warm and dry. No rash noted.  Psychiatric: He has a normal mood and affect. His behavior is normal. Judgment and thought content normal.  testes - per Urology  Lab Results  Component Value Date   WBC 5.7 01/12/2017   HGB 16.3 01/12/2017   HCT 49.2 01/12/2017   PLT 324.0 01/12/2017   GLUCOSE 95 01/12/2017   CHOL 211 (H) 01/12/2017   TRIG 107.0 01/12/2017   HDL 52.10 01/12/2017   LDLCALC 137 (H) 01/12/2017   ALT 25 01/12/2017  AST 23 01/12/2017   NA 137 01/12/2017   K 4.0 01/12/2017   CL 105 01/12/2017   CREATININE 1.29 01/12/2017   BUN 12 01/12/2017   CO2 23 01/12/2017   TSH 0.76 01/12/2017    No results found.  Assessment & Plan:   There are no diagnoses linked to this encounter.   No orders of the defined types were placed in this encounter.    Follow-up: No follow-ups on file.  Sonda PrimesAlex Sherley Mckenney, MD

## 2018-05-17 ENCOUNTER — Telehealth: Payer: Self-pay | Admitting: Internal Medicine

## 2018-05-17 NOTE — Telephone Encounter (Signed)
Patient has dropped off a health form to be completed for his CPE on 02/08/18. Requesting the form be completed by 05/19/18.   Patient never had his blood work done. I have informed him of this and asked him to have this done so the form can be completed.

## 2018-05-18 ENCOUNTER — Other Ambulatory Visit (INDEPENDENT_AMBULATORY_CARE_PROVIDER_SITE_OTHER): Payer: 59

## 2018-05-18 DIAGNOSIS — Z0000 Encounter for general adult medical examination without abnormal findings: Secondary | ICD-10-CM

## 2018-05-18 LAB — BASIC METABOLIC PANEL
BUN: 14 mg/dL (ref 6–23)
CHLORIDE: 104 meq/L (ref 96–112)
CO2: 24 meq/L (ref 19–32)
CREATININE: 1.32 mg/dL (ref 0.40–1.50)
Calcium: 9.8 mg/dL (ref 8.4–10.5)
GFR: 80.4 mL/min (ref >60.00)
Glucose, Bld: 99 mg/dL (ref 70–99)
Potassium: 4.1 mEq/L (ref 3.5–5.1)
Sodium: 137 mEq/L (ref 135–145)

## 2018-05-18 LAB — CBC WITH DIFFERENTIAL/PLATELET
BASOS ABS: 0.1 10*3/uL (ref 0.0–0.1)
Basophils Relative: 0.8 % (ref 0.0–3.0)
EOS ABS: 0.3 10*3/uL (ref 0.0–0.7)
Eosinophils Relative: 4.1 % (ref 0.0–5.0)
HCT: 51 % (ref 39.0–52.0)
HEMOGLOBIN: 16.9 g/dL (ref 13.0–17.0)
Lymphocytes Relative: 22.3 % (ref 12.0–46.0)
Lymphs Abs: 1.6 10*3/uL (ref 0.7–4.0)
MCHC: 33.2 g/dL (ref 30.0–36.0)
MCV: 83.9 fl (ref 78.0–100.0)
Monocytes Absolute: 0.9 10*3/uL (ref 0.1–1.0)
Monocytes Relative: 12 % (ref 3.0–12.0)
Neutro Abs: 4.4 10*3/uL (ref 1.4–7.7)
Neutrophils Relative %: 60.8 % (ref 43.0–77.0)
Platelets: 348 10*3/uL (ref 150.0–400.0)
RBC: 6.07 Mil/uL — AB (ref 4.22–5.81)
RDW: 14.8 % (ref 11.5–15.5)
WBC: 7.2 10*3/uL (ref 4.0–10.5)

## 2018-05-18 LAB — URINALYSIS
Bilirubin Urine: NEGATIVE
Hgb urine dipstick: NEGATIVE
Ketones, ur: NEGATIVE
LEUKOCYTES UA: NEGATIVE
Nitrite: NEGATIVE
SPECIFIC GRAVITY, URINE: 1.015 (ref 1.000–1.030)
Total Protein, Urine: NEGATIVE
Urine Glucose: NEGATIVE
Urobilinogen, UA: 0.2 (ref 0.0–1.0)
pH: 7.5 (ref 5.0–8.0)

## 2018-05-18 LAB — LIPID PANEL
CHOL/HDL RATIO: 4
Cholesterol: 193 mg/dL (ref 0–200)
HDL: 47.7 mg/dL (ref >39.00)
LDL CALC: 116 mg/dL — AB (ref 0–99)
NonHDL: 145.56
Triglycerides: 147 mg/dL (ref 0.0–149.0)
VLDL: 29.4 mg/dL (ref 0.0–40.0)

## 2018-05-18 LAB — HEPATIC FUNCTION PANEL
ALK PHOS: 56 U/L (ref 39–117)
ALT: 69 U/L — ABNORMAL HIGH (ref 0–53)
AST: 49 U/L — ABNORMAL HIGH (ref 0–37)
Albumin: 4.5 g/dL (ref 3.5–5.2)
BILIRUBIN DIRECT: 0.2 mg/dL (ref 0.0–0.3)
TOTAL PROTEIN: 8.3 g/dL (ref 6.0–8.3)
Total Bilirubin: 1.1 mg/dL (ref 0.2–1.2)

## 2018-05-18 LAB — TSH: TSH: 0.88 u[IU]/mL (ref 0.35–4.50)

## 2018-05-19 NOTE — Telephone Encounter (Signed)
Forms completed& signed, Copy sent to scan.   Faxed to [employer] @ [phone], & Also faxed to Casimiro NeedleMichael @[phone].

## 2018-05-22 ENCOUNTER — Other Ambulatory Visit: Payer: Self-pay | Admitting: Internal Medicine

## 2018-05-22 DIAGNOSIS — R7989 Other specified abnormal findings of blood chemistry: Secondary | ICD-10-CM

## 2018-05-22 DIAGNOSIS — R945 Abnormal results of liver function studies: Principal | ICD-10-CM

## 2018-05-24 ENCOUNTER — Encounter: Payer: Self-pay | Admitting: Internal Medicine

## 2018-06-21 ENCOUNTER — Ambulatory Visit
Admission: RE | Admit: 2018-06-21 | Discharge: 2018-06-21 | Disposition: A | Discharge status: Home / Self Care | Payer: 59 | Source: Ambulatory Visit | Attending: Internal Medicine | Admitting: Internal Medicine

## 2018-06-21 DIAGNOSIS — R945 Abnormal results of liver function studies: Principal | ICD-10-CM

## 2018-06-21 DIAGNOSIS — K760 Fatty (change of) liver, not elsewhere classified: Secondary | ICD-10-CM | POA: Diagnosis not present

## 2018-06-21 DIAGNOSIS — R7989 Other specified abnormal findings of blood chemistry: Secondary | ICD-10-CM

## 2019-05-15 ENCOUNTER — Other Ambulatory Visit: Payer: Self-pay

## 2019-05-15 ENCOUNTER — Ambulatory Visit (INDEPENDENT_AMBULATORY_CARE_PROVIDER_SITE_OTHER): Payer: 59 | Admitting: Internal Medicine

## 2019-05-15 ENCOUNTER — Encounter: Payer: Self-pay | Admitting: Internal Medicine

## 2019-05-15 ENCOUNTER — Other Ambulatory Visit (INDEPENDENT_AMBULATORY_CARE_PROVIDER_SITE_OTHER): Payer: 59

## 2019-05-15 VITALS — BP 110/78 | HR 96 | Temp 98.1°F | Ht 74.0 in | Wt 226.0 lb

## 2019-05-15 DIAGNOSIS — D751 Secondary polycythemia: Secondary | ICD-10-CM

## 2019-05-15 DIAGNOSIS — Z0000 Encounter for general adult medical examination without abnormal findings: Secondary | ICD-10-CM

## 2019-05-15 DIAGNOSIS — R7989 Other specified abnormal findings of blood chemistry: Secondary | ICD-10-CM | POA: Diagnosis not present

## 2019-05-15 DIAGNOSIS — F410 Panic disorder [episodic paroxysmal anxiety] without agoraphobia: Secondary | ICD-10-CM

## 2019-05-15 LAB — CBC WITH DIFFERENTIAL/PLATELET
Basophils Absolute: 0.1 10*3/uL (ref 0.0–0.1)
Basophils Relative: 0.6 % (ref 0.0–3.0)
Eosinophils Absolute: 0.5 10*3/uL (ref 0.0–0.7)
Eosinophils Relative: 5.5 % — ABNORMAL HIGH (ref 0.0–5.0)
HCT: 52.1 % — ABNORMAL HIGH (ref 39.0–52.0)
Hemoglobin: 17.2 g/dL — ABNORMAL HIGH (ref 13.0–17.0)
Lymphocytes Relative: 24.1 % (ref 12.0–46.0)
Lymphs Abs: 2 10*3/uL (ref 0.7–4.0)
MCHC: 33.1 g/dL (ref 30.0–36.0)
MCV: 83.1 fl (ref 78.0–100.0)
Monocytes Absolute: 1 10*3/uL (ref 0.1–1.0)
Monocytes Relative: 12 % (ref 3.0–12.0)
Neutro Abs: 4.7 10*3/uL (ref 1.4–7.7)
Neutrophils Relative %: 57.8 % (ref 43.0–77.0)
Platelets: 332 10*3/uL (ref 150.0–400.0)
RBC: 6.27 Mil/uL — ABNORMAL HIGH (ref 4.22–5.81)
RDW: 14.8 % (ref 11.5–15.5)
WBC: 8.2 10*3/uL (ref 4.0–10.5)

## 2019-05-15 LAB — URINALYSIS
Bilirubin Urine: NEGATIVE
Hgb urine dipstick: NEGATIVE
Ketones, ur: NEGATIVE
Leukocytes,Ua: NEGATIVE
Nitrite: NEGATIVE
Specific Gravity, Urine: 1.015 (ref 1.000–1.030)
Total Protein, Urine: NEGATIVE
Urine Glucose: NEGATIVE
Urobilinogen, UA: 0.2 (ref 0.0–1.0)
pH: 6 (ref 5.0–8.0)

## 2019-05-15 LAB — BASIC METABOLIC PANEL
BUN: 11 mg/dL (ref 6–23)
CO2: 27 mEq/L (ref 19–32)
Calcium: 9.6 mg/dL (ref 8.4–10.5)
Chloride: 101 mEq/L (ref 96–112)
Creatinine, Ser: 1.24 mg/dL (ref 0.40–1.50)
GFR: 80.81 mL/min (ref >60.00)
Glucose, Bld: 95 mg/dL (ref 70–99)
Potassium: 4.1 mEq/L (ref 3.5–5.1)
Sodium: 136 mEq/L (ref 135–145)

## 2019-05-15 LAB — HEPATIC FUNCTION PANEL
ALT: 72 U/L — ABNORMAL HIGH (ref 0–53)
AST: 40 U/L — ABNORMAL HIGH (ref 0–37)
Albumin: 4.5 g/dL (ref 3.5–5.2)
Alkaline Phosphatase: 67 U/L (ref 39–117)
Bilirubin, Direct: 0.2 mg/dL (ref 0.0–0.3)
Total Bilirubin: 0.9 mg/dL (ref 0.2–1.2)
Total Protein: 8.3 g/dL (ref 6.0–8.3)

## 2019-05-15 LAB — LIPID PANEL
Cholesterol: 221 mg/dL — ABNORMAL HIGH (ref 0–200)
HDL: 50.7 mg/dL (ref >39.00)
LDL Cholesterol: 144 mg/dL — ABNORMAL HIGH (ref 0–99)
NonHDL: 170.46
Total CHOL/HDL Ratio: 4
Triglycerides: 133 mg/dL (ref 0.0–149.0)
VLDL: 26.6 mg/dL (ref 0.0–40.0)

## 2019-05-15 LAB — TSH: TSH: 1.19 u[IU]/mL (ref 0.35–4.50)

## 2019-05-15 MED ORDER — VITAMIN D3 50 MCG (2000 UT) PO CAPS: 2000.0000 [IU] | ORAL_CAPSULE | Freq: Every day | ORAL | 3 refills | Status: AC

## 2019-05-15 MED ORDER — ALPRAZOLAM 0.25 MG PO TABS: 0.2500 mg | ORAL_TABLET | Freq: Two times a day (BID) | ORAL | 0 refills | Status: DC | PRN

## 2019-05-15 NOTE — Patient Instructions (Signed)
These suggestions will probably help you to improve your metabolism if you are not overweight and to lose weight if you are overweight: 1.  Reduce your consumption of sugars and starches.  Eliminate high fructose corn syrup from your diet.  Reduce your consumption of processed foods.  For desserts try to have seasonal fruits, berries, nuts, cheeses or dark chocolate with more than 70% cacao. 2.  Do not snack 3.  You do not have to eat breakfast.  If you choose to have breakfast-eat plain greek yogurt, eggs, oatmeal (without sugar) 4.  Drink water, freshly brewed unsweetened tea ([place], black or herbal) or coffee.  Do not drink sodas including diet sodas , juices, beverages sweetened with artificial sweeteners. 5.  Reduce your consumption of refined grains. 6.  Avoid protein drinks such as Optifast, Slim fast etc. Eat chicken, fish, meat, dairy and beans for your sources of protein 7.  Natural unprocessed fats like cold pressed virgin olive oil, butter, coconut oil are good for you.  Eat avocados 8.  Increase your consumption of fiber.  Fruits, berries, vegetables, whole grains, flaxseeds, Chia seeds, beans, popcorn, nuts, oatmeal are good sources of fiber 9.  Use vinegar in your diet, i.e. apple cider vinegar, red wine or balsamic vinegar 10.  You can try fasting.  For example you can skip breakfast and lunch every other day (24-hour fast) 11.  Stress reduction, good night sleep, relaxation, meditation, yoga and other physical activity is likely to help you to maintain low weight too. 12.  If you drink alcohol, limit your alcohol intake to no more than 2 drinks a day.   Mediterranean diet is good for you. ([employer] has a typical Mediterranean cuisine menu) The Mediterranean diet is a way of eating based on the traditional cuisine of countries bordering the [place] Sea. While there is no single definition of the Mediterranean diet, it is typically high in vegetables, fruits, whole grains,  beans, nut and seeds, and olive oil. The main components of Mediterranean diet include: . Daily consumption of vegetables, fruits, whole grains and healthy fats  . Weekly intake of fish, poultry, beans and eggs  . Moderate portions of dairy products  . Limited intake of red meat Other important elements of the Mediterranean diet are sharing meals with family and friends, enjoying a glass of red wine and being physically active. Health benefits of a Mediterranean diet: A traditional Mediterranean diet consisting of large quantities of fresh fruits and vegetables, nuts, fish and olive oil-coupled with physical activity-can reduce your risk of serious mental and physical health problems by: Preventing heart disease and strokes. Following a Mediterranean diet limits your intake of refined breads, processed foods, and red meat, and encourages drinking red wine instead of hard liquor-all factors that can help prevent heart disease and stroke. Keeping you agile. If you're an older adult, the nutrients gained with a Mediterranean diet may reduce your risk of developing muscle weakness and other signs of frailty by about 70 percent. Reducing the risk of Alzheimer's. Research suggests that the Mediterranean diet may improve cholesterol, blood sugar levels, and overall blood vessel health, which in turn may reduce your risk of Alzheimer's disease or dementia. Halving the risk of Parkinson's disease. The high levels of antioxidants in the Mediterranean diet can prevent cells from undergoing a damaging process called oxidative stress, thereby cutting the risk of Parkinson's disease in half. Increasing longevity. By reducing your risk of developing heart disease or cancer with the Mediterranean diet,   you're reducing your risk of death at any age by 20%. Protecting against type 2 diabetes. A Mediterranean diet is rich in fiber which digests slowly, prevents huge swings in blood sugar, and can help you maintain a  healthy weight.    Cabbage soup recipe that will not make you gain weight: Take 1 small head of cabbage, 1 average pack of celery, 4 Crigler peppers, 4 onions, 2 cans diced tomatoes (they are not available without salt), salt and spices to taste.  Chop cabbage, celery, peppers and onions.  And tomatoes and 2-2.5 liters (2.5 quarts) of water so that it would just cover the vegetables.  Bring to boil.  Add spices and salt.  Turn heat to low/medium and simmer for 20-25 minutes.  Naturally, you can make a smaller batch and change some of the ingredients.  

## 2019-05-15 NOTE — Progress Notes (Signed)
Subjective:  Patient ID: Zachary Bond, male    DOB: [birth year]  Age: 33 y.o. 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 Negative for appetite change and fatigue.  HENT: Negative for congestion, nosebleeds, sneezing, sore throat and trouble swallowing.   Eyes: Negative for itching and visual disturbance.  Respiratory: Negative for cough.   Cardiovascular: Negative for chest pain, palpitations and leg swelling.  Gastrointestinal: Negative for abdominal distention, blood in stool, diarrhea and nausea.  Genitourinary: Negative for frequency and hematuria.  Musculoskeletal: Negative for back pain, gait problem, joint swelling and neck pain.  Skin: Negative for rash.  Neurological: Negative for dizziness, tremors, speech difficulty and weakness.  Psychiatric/Behavioral: Negative for agitation, dysphoric mood, sleep disturbance and suicidal ideas. The patient is nervous/anxious.     Objective:  BP 110/78 (BP Location: Left Arm, Patient Position: Sitting, Cuff Size: Large)   Pulse 96   Temp 98.1 F (36.7 C) (Oral)   Ht 6\' 2"  (1.88 m)   Wt 226 lb (102.5 kg)   SpO2 97%   BMI 29.02 kg/m   BP Readings from Last 3 Encounters:  05/15/19 110/78  02/08/18 126/82  01/12/17 122/84    Wt Readings from Last 3 Encounters:  05/15/19 226 lb (102.5 kg)  02/08/18 210 lb (95.3 kg)  01/12/17 210 lb (95.3 kg)    Physical Exam Constitutional:      General: He is not in acute distress.    Appearance: He is well-developed.     Comments: NAD  Eyes:     Conjunctiva/sclera: Conjunctivae normal.     Pupils: Pupils are equal, round, and reactive to light.  Neck:     Musculoskeletal: Normal range of motion.     Thyroid: No  thyromegaly.     Vascular: No JVD.  Cardiovascular:     Rate and Rhythm: Normal rate and regular rhythm.     Heart sounds: Normal heart sounds. No murmur. No friction rub. No gallop.   Pulmonary:     Effort: Pulmonary effort is normal. No respiratory distress.     Breath sounds: Normal breath sounds. No wheezing or rales.  Chest:     Chest wall: No tenderness.  Abdominal:     General: Bowel sounds are normal. There is no distension.     Palpations: Abdomen is soft. There is no mass.     Tenderness: There is no abdominal tenderness. There is no guarding or rebound.  Musculoskeletal: Normal range of motion.        General: No tenderness.  Lymphadenopathy:     Cervical: No cervical adenopathy.  Skin:    General: Skin is warm and dry.     Findings: No rash.  Neurological:     Mental Status: He is alert and oriented to person, place, and time.     Cranial Nerves: No cranial nerve deficit.     Motor: No abnormal muscle tone.     Coordination: Coordination normal.     Gait: Gait normal.     Deep Tendon Reflexes: Reflexes are normal and symmetric.  Psychiatric:        Behavior: Behavior normal.        Thought Content: Thought content normal.  Judgment: Judgment normal.     Lab Results  Component Value Date   WBC 7.2 05/18/2018   HGB 16.9 05/18/2018   HCT 51.0 05/18/2018   PLT 348.0 05/18/2018   GLUCOSE 99 05/18/2018   CHOL 193 05/18/2018   TRIG 147.0 05/18/2018   HDL 47.70 05/18/2018   LDLCALC 116 (H) 05/18/2018   ALT 69 (H) 05/18/2018   AST 49 (H) 05/18/2018   NA 137 05/18/2018   K 4.1 05/18/2018   CL 104 05/18/2018   CREATININE 1.32 05/18/2018   BUN 14 05/18/2018   CO2 24 05/18/2018   TSH 0.88 05/18/2018    US Abdomen Limited Ruq  Result Date: 06/21/2018 CLINICAL DATA:  33 year old male with elevated LFTs. EXAM: ULTRASOUND ABDOMEN LIMITED RIGHT UPPER QUADRANT COMPARISON:  No prior abdominal imaging. FINDINGS: Gallbladder: No gallstones or wall thickening  visualized. No sonographic Murphy sign noted by sonographer. Common bile duct: Diameter: 3 millimeters, normal. Liver: Echogenic liver (image 24). No discrete liver lesion. No intrahepatic biliary ductal dilatation. Portal vein is patent on color Doppler imaging with normal direction of blood flow towards the liver. Other findings: Negative visible right kidney. IMPRESSION: Fatty liver disease but otherwise normal right upper quadrant ultrasound. Electronically Signed   By: Genevie Ann M.D.   On: 06/21/2018 15:47    Assessment & Plan:   There are no diagnoses linked to this encounter.   No orders of the defined types were placed in this encounter.    Follow-up: No follow-ups on file.  Walker Kehr, MD

## 2019-05-19 ENCOUNTER — Other Ambulatory Visit: Payer: Self-pay | Admitting: Internal Medicine

## 2019-05-19 DIAGNOSIS — R7989 Other specified abnormal findings of blood chemistry: Secondary | ICD-10-CM | POA: Insufficient documentation

## 2019-05-19 DIAGNOSIS — D751 Secondary polycythemia: Secondary | ICD-10-CM | POA: Insufficient documentation

## 2019-05-27 NOTE — Assessment & Plan Note (Signed)
Discussed options.  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  Xanax as needed infrequently.  Potential benefits of a long term benzodiazepines  use as well as potential risks  and complications were explained to the patient and were aknowledged.

## 2019-05-27 NOTE — Assessment & Plan Note (Signed)
Reduce alcohol, loose 5-10 lbs

## 2019-05-27 NOTE — Assessment & Plan Note (Signed)
The patient denies sleep apnea symptoms

## 2019-08-16 IMAGING — US US ABDOMEN LIMITED
1 series · 14 of 25 positions shown · non-contrast
Comparison: No prior abdominal imaging.

CLINICAL DATA: 31-year-old male with elevated LFTs.

EXAM:
ULTRASOUND ABDOMEN LIMITED RIGHT UPPER QUADRANT

[Series 1: us abdomen limited · 0.13mm/px · 14 of 41 slices shown]
[im 1/41]
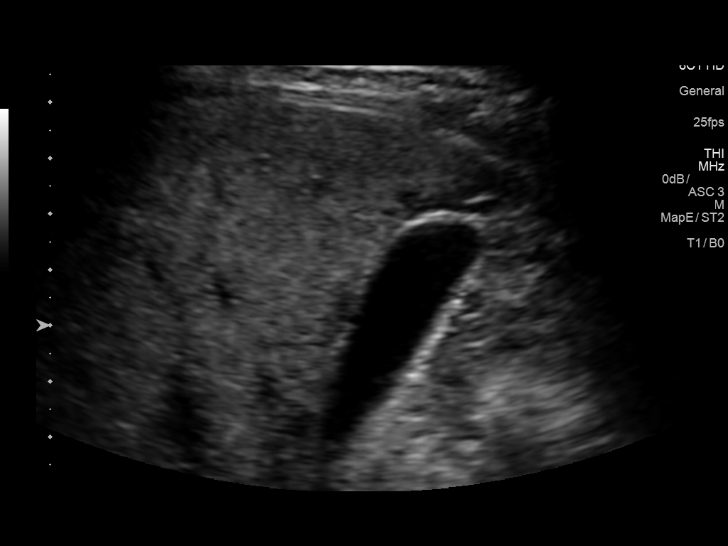
[im 4/41]
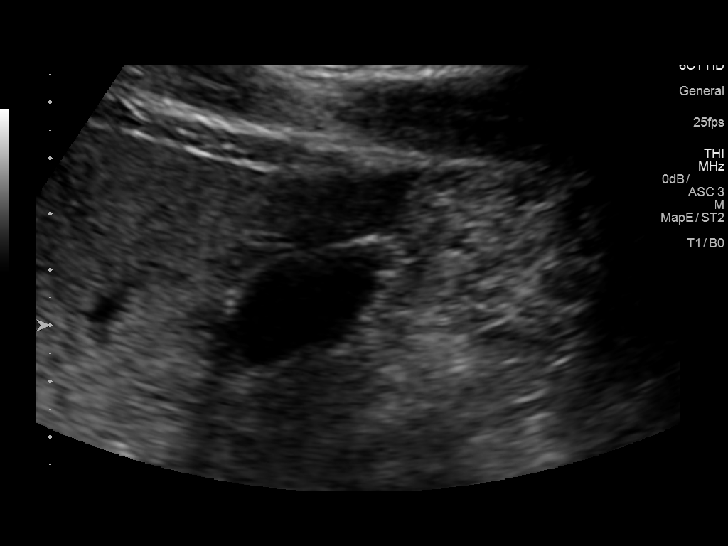
[im 7/41]
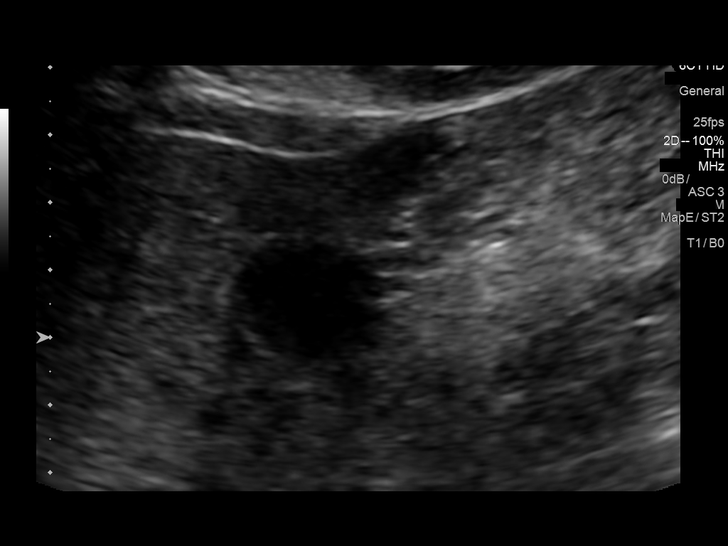
[im 11/41]
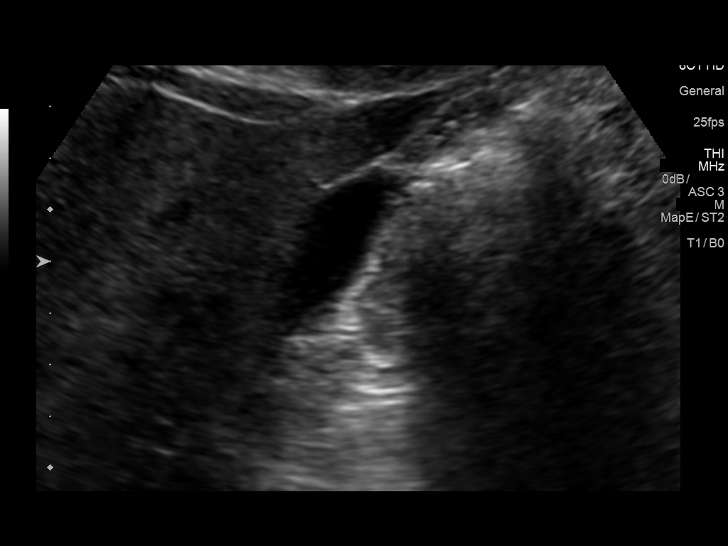
[im 14/41]
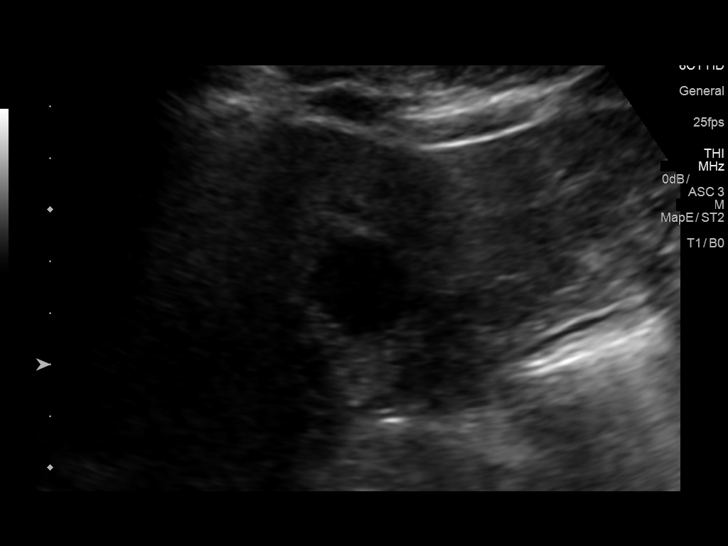
[im 16/41]
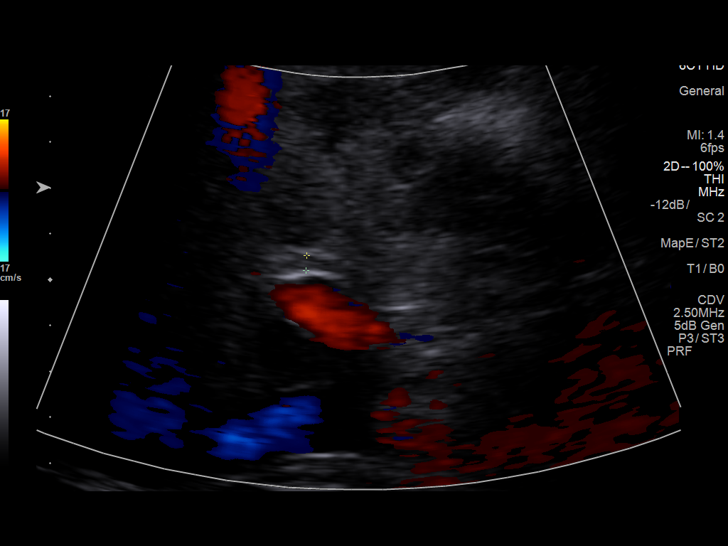
[im 19/41]
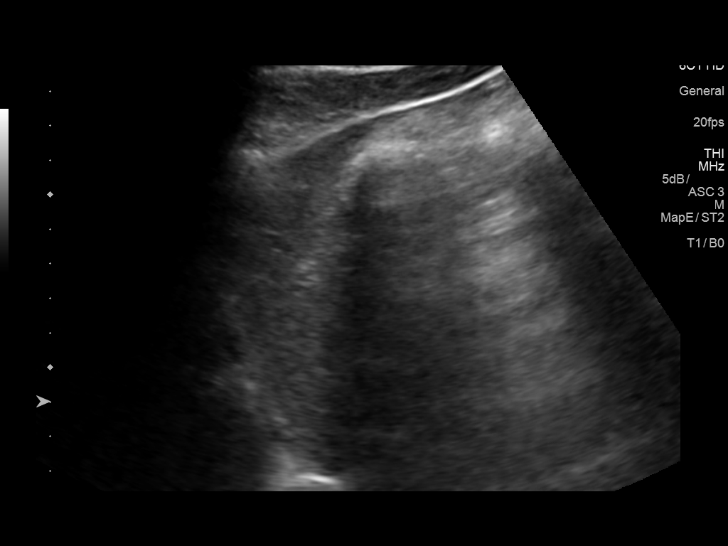
[im 22/41]
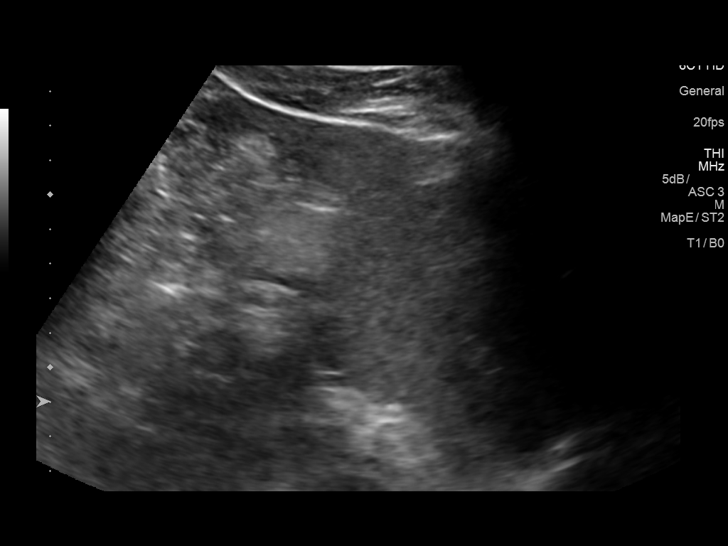
[im 26/41]
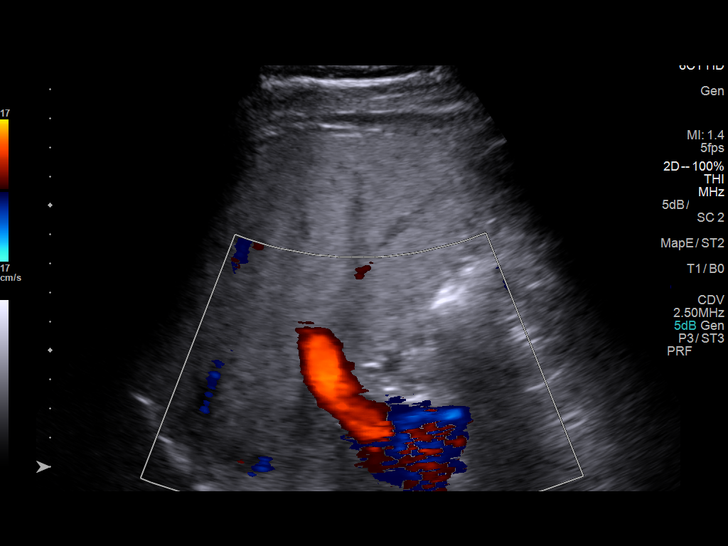
[im 27/41]
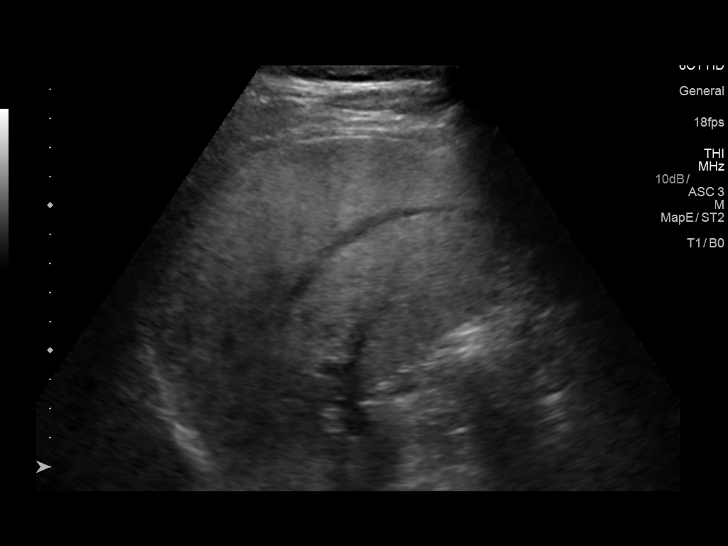
[im 31/41]
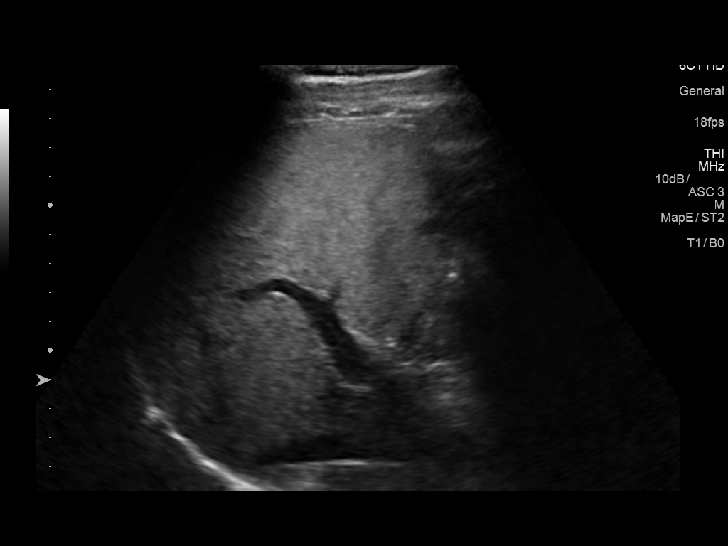
[im 34/41]
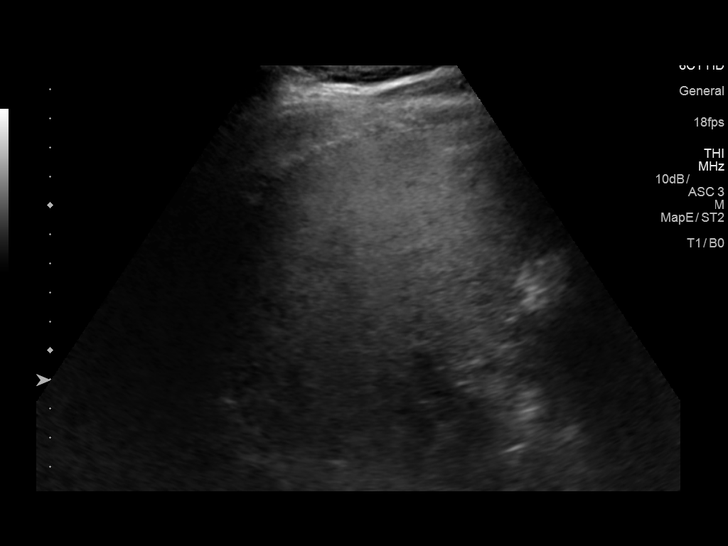
[im 37/41]
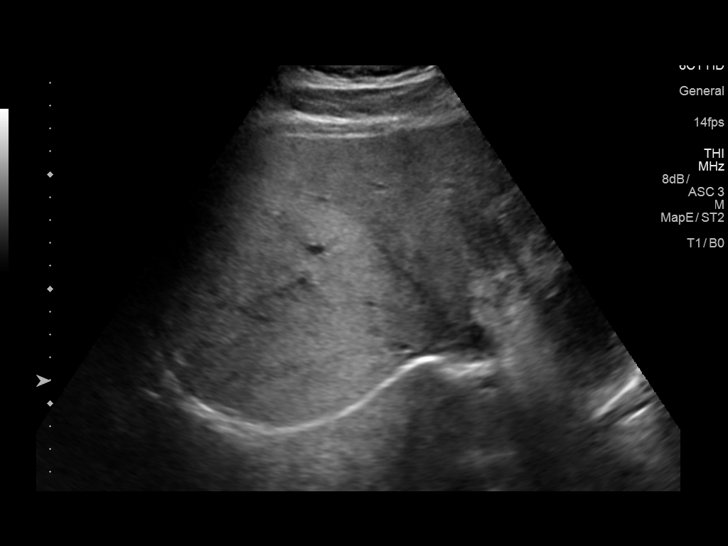
[im 41/41]
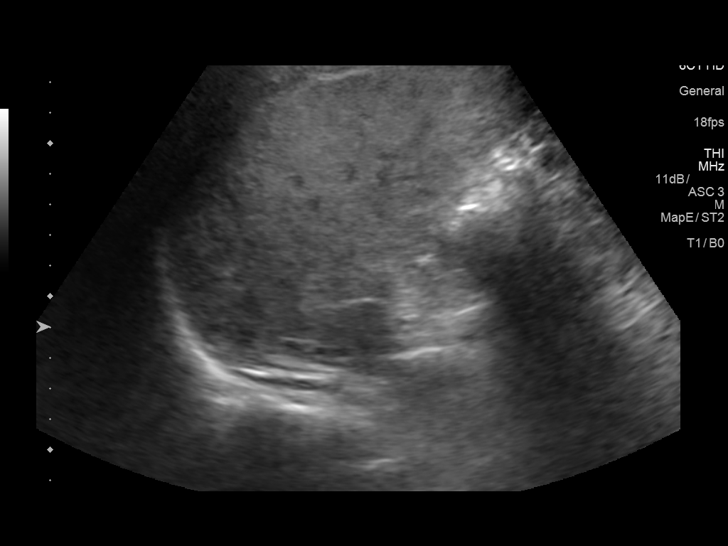

[14 of 25 positions shown; findings below may reference images not displayed]

FINDINGS: Gallbladder:

No gallstones or wall thickening visualized. No sonographic Murphy
sign noted by sonographer.

Common bile duct:

Diameter: 3 millimeters, normal.

Liver:

Echogenic liver (image 24). No discrete liver lesion. No
intrahepatic biliary ductal dilatation. Portal vein is patent on
color Doppler imaging with normal direction of blood flow towards
the liver.

Other findings: Negative visible right kidney.
IMPRESSION: Fatty liver disease but otherwise normal right upper quadrant
ultrasound.

## 2021-07-14 ENCOUNTER — Encounter: Payer: 59 | Admitting: Internal Medicine

## 2021-10-07 ENCOUNTER — Ambulatory Visit: Payer: 59 | Admitting: Internal Medicine

## 2021-10-14 ENCOUNTER — Encounter: Payer: Self-pay | Admitting: Internal Medicine

## 2021-10-14 ENCOUNTER — Ambulatory Visit (INDEPENDENT_AMBULATORY_CARE_PROVIDER_SITE_OTHER): Payer: 59 | Admitting: Internal Medicine

## 2021-10-14 VITALS — BP 118/82 | HR 84 | Temp 97.8°F | Ht 74.0 in | Wt 212.0 lb

## 2021-10-14 DIAGNOSIS — F410 Panic disorder [episodic paroxysmal anxiety] without agoraphobia: Secondary | ICD-10-CM

## 2021-10-14 DIAGNOSIS — R7989 Other specified abnormal findings of blood chemistry: Secondary | ICD-10-CM

## 2021-10-14 DIAGNOSIS — Z0000 Encounter for general adult medical examination without abnormal findings: Secondary | ICD-10-CM

## 2021-10-14 DIAGNOSIS — Z136 Encounter for screening for cardiovascular disorders: Secondary | ICD-10-CM

## 2021-10-14 LAB — URINALYSIS
Bilirubin Urine: NEGATIVE
Hgb urine dipstick: NEGATIVE
Ketones, ur: NEGATIVE
Leukocytes,Ua: NEGATIVE
Nitrite: NEGATIVE
Specific Gravity, Urine: <=1.005 — AB (ref 1.000–1.030)
Total Protein, Urine: NEGATIVE
Urine Glucose: NEGATIVE
Urobilinogen, UA: 0.2 (ref 0.0–1.0)
pH: 6.5 (ref 5.0–8.0)

## 2021-10-14 LAB — LIPID PANEL
Cholesterol: 234 mg/dL — ABNORMAL HIGH (ref 0–200)
HDL: 55.7 mg/dL (ref >39.00)
LDL Cholesterol: 161 mg/dL — ABNORMAL HIGH (ref 0–99)
NonHDL: 178.05
Total CHOL/HDL Ratio: 4
Triglycerides: 85 mg/dL (ref 0.0–149.0)
VLDL: 17 mg/dL (ref 0.0–40.0)

## 2021-10-14 LAB — COMPREHENSIVE METABOLIC PANEL
ALT: 58 U/L — ABNORMAL HIGH (ref 0–53)
AST: 41 U/L — ABNORMAL HIGH (ref 0–37)
Albumin: 4.7 g/dL (ref 3.5–5.2)
Alkaline Phosphatase: 54 U/L (ref 39–117)
BUN: 10 mg/dL (ref 6–23)
CO2: 27 mEq/L (ref 19–32)
Calcium: 9.5 mg/dL (ref 8.4–10.5)
Chloride: 101 mEq/L (ref 96–112)
Creatinine, Ser: 1.18 mg/dL (ref 0.40–1.50)
GFR: 79.51 mL/min (ref >60.00)
Glucose, Bld: 93 mg/dL (ref 70–99)
Potassium: 3.9 mEq/L (ref 3.5–5.1)
Sodium: 137 mEq/L (ref 135–145)
Total Bilirubin: 1.2 mg/dL (ref 0.2–1.2)
Total Protein: 8.3 g/dL (ref 6.0–8.3)

## 2021-10-14 LAB — CBC WITH DIFFERENTIAL/PLATELET
Basophils Absolute: 0.1 10*3/uL (ref 0.0–0.1)
Basophils Relative: 1.1 % (ref 0.0–3.0)
Eosinophils Absolute: 0.3 10*3/uL (ref 0.0–0.7)
Eosinophils Relative: 5.4 % — ABNORMAL HIGH (ref 0.0–5.0)
HCT: 52.5 % — ABNORMAL HIGH (ref 39.0–52.0)
Hemoglobin: 17.2 g/dL — ABNORMAL HIGH (ref 13.0–17.0)
Lymphocytes Relative: 20.6 % (ref 12.0–46.0)
Lymphs Abs: 1.1 10*3/uL (ref 0.7–4.0)
MCHC: 32.8 g/dL (ref 30.0–36.0)
MCV: 84.1 fl (ref 78.0–100.0)
Monocytes Absolute: 0.7 10*3/uL (ref 0.1–1.0)
Monocytes Relative: 12.5 % — ABNORMAL HIGH (ref 3.0–12.0)
Neutro Abs: 3.2 10*3/uL (ref 1.4–7.7)
Neutrophils Relative %: 60.4 % (ref 43.0–77.0)
Platelets: 365 10*3/uL (ref 150.0–400.0)
RBC: 6.25 Mil/uL — ABNORMAL HIGH (ref 4.22–5.81)
RDW: 14.4 % (ref 11.5–15.5)
WBC: 5.4 10*3/uL (ref 4.0–10.5)

## 2021-10-14 LAB — TSH: TSH: 0.92 u[IU]/mL (ref 0.35–5.50)

## 2021-10-14 NOTE — Assessment & Plan Note (Signed)
Pt lost wt - intermittent fasting ?Check LFTs ?

## 2021-10-14 NOTE — Assessment & Plan Note (Signed)
Doing well ?Try Valerian root for anxiety ?

## 2021-10-14 NOTE — Assessment & Plan Note (Signed)

## 2021-10-14 NOTE — Patient Instructions (Addendum)
Sign up for [employer] ( via [employer] on your phone or your ipad). If you don't have a Engineering geologist card  - go to [employer] branch. They will set you up in 15 minutes. It is free. You can check out books to read and to listen, check out magazines and newspapers, movies etc. ? ? ?The Obesity Code book by Wylene Simmer ? ? ?These suggestions will probably help you to improve your metabolism if you are not overweight and to lose weight if you are overweight: ?1.  Reduce your consumption of sugars and starches.  Eliminate high fructose corn syrup from your diet.  Reduce your consumption of processed foods.  For desserts try to have seasonal fruits, berries, nuts, cheeses or dark chocolate with more than 70% cacao. ?2.  Do not snack ?3.  You do not have to eat breakfast.  If you choose to have breakfast - eat plain greek yogurt, eggs, oatmeal (without sugar) - use honey if you need to. ?4.  Drink water, freshly brewed unsweetened tea ([place], black or herbal) or coffee.  Do not drink sodas including diet sodas , juices, beverages sweetened with artificial sweeteners. ?5.  Reduce your consumption of refined grains. ?6.  Avoid protein drinks such as Optifast, Slim fast etc. Eat chicken, fish, meat, dairy and beans for your sources of protein. ?7.  Natural unprocessed fats like cold pressed virgin olive oil, butter, coconut oil are good for you.  Eat avocados. ?8.  Increase your consumption of fiber.  Fruits, berries, vegetables, whole grains, flaxseed, chia seeds, beans, popcorn, nuts, oatmeal are good sources of fiber ?9.  Use vinegar in your diet, i.e. apple cider vinegar, red wine or balsamic vinegar ?10.  You can try fasting.  For example you can skip breakfast and lunch every other day (24-hour fast) ?11.  Stress reduction, good night sleep, relaxation, meditation, yoga and other physical activity is likely to help you to maintain low weight too. ?12.  If you drink alcohol, limit your alcohol intake to no more than  2 drinks a day. ? ?Try Valerian root for anxiety ? ? ? ?

## 2021-10-14 NOTE — Progress Notes (Signed)
? ?Subjective:  ?Patient ID: Zachary Bond, male    DOB: [birth year]  Age: 36 y.o. MRN: [id number] ? ?CC: No chief complaint on file. ? ? ?HPI ?Zachary Bond presents for a well exam ? ?Outpatient Medications Prior to Visit  ?Medication Sig Dispense Refill  ? ALPRAZolam (XANAX) 0.25 MG tablet Take 1 tablet (0.25 mg total) by mouth 2 (two) times daily as needed for anxiety. (Patient not taking: Reported on 10/14/2021) 30 tablet 0  ? Cholecalciferol (VITAMIN D3) 50 MCG (2000 UT) capsule Take 1 capsule (2,000 Units total) by mouth daily. (Patient not taking: Reported on 10/14/2021) 100 capsule 3  ? ?No facility-administered medications prior to visit.  ? ? ?ROS: ?Review of Systems  ?Constitutional:  Negative for appetite change, fatigue and unexpected weight change.  ?HENT:  Negative for congestion, nosebleeds, sneezing, sore throat and trouble swallowing.   ?Eyes:  Negative for itching and visual disturbance.  ?Respiratory:  Negative for cough.   ?Cardiovascular:  Negative for chest pain, palpitations and leg swelling.  ?Gastrointestinal:  Negative for abdominal distention, blood in stool, diarrhea and nausea.  ?Genitourinary:  Negative for frequency and hematuria.  ?Musculoskeletal:  Negative for back pain, gait problem, joint swelling and neck pain.  ?Skin:  Negative for rash.  ?Neurological:  Negative for dizziness, tremors, speech difficulty and weakness.  ?Psychiatric/Behavioral:  Negative for agitation, dysphoric mood, sleep disturbance and suicidal ideas. The patient is not nervous/anxious.   ? ?Objective:  ?BP 118/82 (BP Location: Left Arm, Patient Position: Sitting, Cuff Size: Large)   Pulse 84   Temp 97.8 ?F (36.6 ?C) (Oral)   Ht 6\' 2"  (1.88 m)   Wt 212 lb (96.2 kg)   SpO2 98%   BMI 27.22 kg/m?  ? ?BP Readings from Last 3 Encounters:  ?10/14/21 118/82  ?05/15/19 110/78  ?02/08/18 126/82  ? ? ?Wt Readings from Last 3 Encounters:  ?10/14/21 212 lb (96.2 kg)  ?05/15/19 226 lb (102.5 kg)  ?02/08/18 210 lb  (95.3 kg)  ? ? ?Physical Exam ?Constitutional:   ?   General: He is not in acute distress. ?   Appearance: He is well-developed.  ?   Comments: NAD  ?Eyes:  ?   Conjunctiva/sclera: Conjunctivae normal.  ?   Pupils: Pupils are equal, round, and reactive to light.  ?Neck:  ?   Thyroid: No thyromegaly.  ?   Vascular: No JVD.  ?Cardiovascular:  ?   Rate and Rhythm: Normal rate and regular rhythm.  ?   Heart sounds: Normal heart sounds. No murmur heard. ?  No friction rub. No gallop.  ?Pulmonary:  ?   Effort: Pulmonary effort is normal. No respiratory distress.  ?   Breath sounds: Normal breath sounds. No wheezing or rales.  ?Chest:  ?   Chest wall: No tenderness.  ?Abdominal:  ?   General: Bowel sounds are normal. There is no distension.  ?   Palpations: Abdomen is soft. There is no mass.  ?   Tenderness: There is no abdominal tenderness. There is no guarding or rebound.  ?Musculoskeletal:     ?   General: No tenderness. Normal range of motion.  ?   Cervical back: Normal range of motion.  ?Lymphadenopathy:  ?   Cervical: No cervical adenopathy.  ?Skin: ?   General: Skin is warm and dry.  ?   Findings: No rash.  ?Neurological:  ?   Mental Status: He is alert and oriented to person, place, and time.  ?  Cranial Nerves: No cranial nerve deficit.  ?   Motor: No abnormal muscle tone.  ?   Coordination: Coordination normal.  ?   Gait: Gait normal.  ?   Deep Tendon Reflexes: Reflexes are normal and symmetric.  ?Psychiatric:     ?   Behavior: Behavior normal.     ?   Thought Content: Thought content normal.     ?   Judgment: Judgment normal.  ? ? ?Lab Results  ?Component Value Date  ? WBC 8.2 05/15/2019  ? HGB 17.2 (H) 05/15/2019  ? HCT 52.1 (H) 05/15/2019  ? PLT 332.0 05/15/2019  ? GLUCOSE 95 05/15/2019  ? CHOL 221 (H) 05/15/2019  ? TRIG 133.0 05/15/2019  ? HDL 50.70 05/15/2019  ? LDLCALC 144 (H) 05/15/2019  ? ALT 72 (H) 05/15/2019  ? AST 40 (H) 05/15/2019  ? NA 136 05/15/2019  ? K 4.1 05/15/2019  ? CL 101 05/15/2019  ?  CREATININE 1.24 05/15/2019  ? BUN 11 05/15/2019  ? CO2 27 05/15/2019  ? TSH 1.19 05/15/2019  ? ? ?US Abdomen Limited RUQ ? ?Result Date: 06/21/2018 ?CLINICAL DATA:  36 year old male with elevated LFTs. EXAM: ULTRASOUND ABDOMEN LIMITED RIGHT UPPER QUADRANT COMPARISON:  No prior abdominal imaging. FINDINGS: Gallbladder: No gallstones or wall thickening visualized. No sonographic Murphy sign noted by sonographer. Common bile duct: Diameter: 3 millimeters, normal. Liver: Echogenic liver (image 24). No discrete liver lesion. No intrahepatic biliary ductal dilatation. Portal vein is patent on color Doppler imaging with normal direction of blood flow towards the liver. Other findings: Negative visible right kidney. IMPRESSION: Fatty liver disease but otherwise normal right upper quadrant ultrasound. Electronically Signed   By: Odessa Fleming M.D.   On: 06/21/2018 15:47  ? ? ?Assessment & Plan:  ? ?Problem List Items Addressed This Visit   ? ? Well adult exam  ?   ?We discussed age appropriate health related issues, including available/recomended screening tests and vaccinations. Labs were ordered to be later reviewed . All questions were answered. We discussed one or more of the following - seat belt use, use of sunscreen/sun exposure exercise, fall risk reduction, second hand smoke exposure, firearm use and storage, seat belt use, a need for adhering to healthy diet and exercise. ?Labs were ordered.  All questions were answered. ? ? ?  ?  ?  ? ? ?No orders of the defined types were placed in this encounter. ?  ? ? ?Follow-up: Return in about 1 year (around 10/15/2022) for Wellness Exam. ? ?Sonda Primes, MD ?

## 2024-07-25 ENCOUNTER — Encounter: Payer: Self-pay | Admitting: Internal Medicine

## 2024-07-25 ENCOUNTER — Ambulatory Visit: Payer: Self-pay | Admitting: Internal Medicine

## 2024-07-25 VITALS — BP 130/82 | HR 90 | Temp 98.2°F | Ht 74.0 in | Wt 205.0 lb

## 2024-07-25 DIAGNOSIS — Z0000 Encounter for general adult medical examination without abnormal findings: Secondary | ICD-10-CM

## 2024-07-25 MED ORDER — TRIAMCINOLONE ACETONIDE 0.5 % EX OINT: 1.0000 | TOPICAL_OINTMENT | Freq: Four times a day (QID) | CUTANEOUS | 1 refills | Status: AC | PRN

## 2024-07-25 NOTE — Assessment & Plan Note (Addendum)
" °  We discussed age appropriate health related issues, including available/recomended screening tests and vaccinations. Labs were ordered to be later reviewed . All questions were answered. We discussed one or more of the following - seat belt use, use of sunscreen/sun exposure exercise, fall risk reduction, second hand smoke exposure, firearm use and storage, seat belt use, a need for adhering to healthy diet and exercise. Labs were ordered.  All questions were answered. Take Vit D   "

## 2024-07-25 NOTE — Progress Notes (Signed)
 "  Subjective:  Patient ID: Zachary Bond, male    DOB: 17-Dec-1985  Age: 39 y.o. MRN: [id number]  CC: No chief complaint on file.   HPI Zachary Bond presents for a well exam [place] has been doing well.  Outpatient Medications Prior to Visit  Medication Sig Dispense Refill   Cholecalciferol (VITAMIN D3) 50 MCG (2000 UT) capsule Take 1 capsule (2,000 Units total) by mouth daily. (Patient not taking: Reported on 07/25/2024) 100 capsule 3   ALPRAZolam  (XANAX ) 0.25 MG tablet Take 1 tablet (0.25 mg total) by mouth 2 (two) times daily as needed for anxiety. (Patient not taking: Reported on 07/25/2024) 30 tablet 0   No facility-administered medications prior to visit.    ROS: Review of Systems  Constitutional:  Negative for appetite change, fatigue and unexpected weight change.  HENT:  Negative for congestion, nosebleeds, sneezing, sore throat and trouble swallowing.   Eyes:  Negative for itching and visual disturbance.  Respiratory:  Negative for cough.   Cardiovascular:  Negative for chest pain, palpitations and leg swelling.  Gastrointestinal:  Negative for abdominal distention, blood in stool, diarrhea and nausea.  Genitourinary:  Negative for frequency and hematuria.  Musculoskeletal:  Negative for back pain, gait problem, joint swelling and neck pain.  Skin:  Negative for rash.  Neurological:  Negative for dizziness, tremors, speech difficulty and weakness.  Psychiatric/Behavioral:  Negative for agitation, dysphoric mood, sleep disturbance and suicidal ideas. The patient is not nervous/anxious.     Objective:  BP 130/82   Pulse 90   Temp 98.2 F (36.8 C) (Oral)   Ht 6' 2 (1.88 m)   Wt 205 lb (93 kg)   SpO2 97%   BMI 26.32 kg/m   BP Readings from Last 3 Encounters:  07/25/24 130/82  10/14/21 118/82  05/15/19 110/78    Wt Readings from Last 3 Encounters:  07/25/24 205 lb (93 kg)  10/14/21 212 lb (96.2 kg)  05/15/19 226 lb (102.5 kg)    Physical  Exam Constitutional:      General: He is not in acute distress.    Appearance: He is well-developed.     Comments: NAD  Eyes:     Conjunctiva/sclera: Conjunctivae normal.     Pupils: Pupils are equal, round, and reactive to light.  Neck:     Thyroid : No thyromegaly.     Vascular: No JVD.  Cardiovascular:     Rate and Rhythm: Normal rate and regular rhythm.     Heart sounds: Normal heart sounds. No murmur heard.    No friction rub. No gallop.  Pulmonary:     Effort: Pulmonary effort is normal. No respiratory distress.     Breath sounds: Normal breath sounds. No wheezing or rales.  Chest:     Chest wall: No tenderness.  Abdominal:     General: Bowel sounds are normal. There is no distension.     Palpations: Abdomen is soft. There is no mass.     Tenderness: There is no abdominal tenderness. There is no guarding or rebound.  Musculoskeletal:        General: No tenderness. Normal range of motion.     Cervical back: Normal range of motion.  Lymphadenopathy:     Cervical: No cervical adenopathy.  Skin:    General: Skin is warm and dry.     Findings: No rash.  Neurological:     Mental Status: He is alert and oriented to person, place, and time.     Cranial Nerves:  No cranial nerve deficit.     Motor: No abnormal muscle tone.     Coordination: Coordination normal.     Gait: Gait normal.     Deep Tendon Reflexes: Reflexes are normal and symmetric.  Psychiatric:        Behavior: Behavior normal.        Thought Content: Thought content normal.        Judgment: Judgment normal.     Lab Results  Component Value Date   WBC 5.4 10/14/2021   HGB 17.2 (H) 10/14/2021   HCT 52.5 (H) 10/14/2021   PLT 365.0 10/14/2021   GLUCOSE 93 10/14/2021   CHOL 234 (H) 10/14/2021   TRIG 85.0 10/14/2021   HDL 55.70 10/14/2021   LDLCALC 161 (H) 10/14/2021   ALT 58 (H) 10/14/2021   AST 41 (H) 10/14/2021   NA 137 10/14/2021   K 3.9 10/14/2021   CL 101 10/14/2021   CREATININE 1.18 10/14/2021    BUN 10 10/14/2021   CO2 27 10/14/2021   TSH 0.92 10/14/2021    US  Abdomen Limited RUQ Result Date: 06/21/2018 CLINICAL DATA:  39 year old male with elevated LFTs. EXAM: ULTRASOUND ABDOMEN LIMITED RIGHT UPPER QUADRANT COMPARISON:  No prior abdominal imaging. FINDINGS: Gallbladder: No gallstones or wall thickening visualized. No sonographic Murphy sign noted by sonographer. Common bile duct: Diameter: 3 millimeters, normal. Liver: Echogenic liver (image 24). No discrete liver lesion. No intrahepatic biliary ductal dilatation. Portal vein is patent on color Doppler imaging with normal direction of blood flow towards the liver. Other findings: Negative visible right kidney. IMPRESSION: Fatty liver disease but otherwise normal right upper quadrant ultrasound. Electronically Signed   By: VEAR Hurst M.D.   On: 06/21/2018 15:47    Assessment & Plan:   Problem List Items Addressed This Visit     Well adult exam - Primary    We discussed age appropriate health related issues, including available/recomended screening tests and vaccinations. Labs were ordered to be later reviewed . All questions were answered. We discussed one or more of the following - seat belt use, use of sunscreen/sun exposure exercise, fall risk reduction, second hand smoke exposure, firearm use and storage, seat belt use, a need for adhering to healthy diet and exercise. Labs were ordered.  All questions were answered. Take Vit D        Relevant Orders   TSH   Urinalysis   CBC with Differential/Platelet   Lipid panel   PSA   Comprehensive metabolic panel with GFR      Meds ordered this encounter  Medications   triamcinolone  ointment (KENALOG ) 0.5 %    Sig: Apply 1 Application topically 4 (four) times daily as needed (cracked lips).    Dispense:  60 g    Refill:  1      Follow-up: Return in about 3 months (around 10/23/2024) for a follow-up visit.  Marolyn Noel, MD "

## 2024-07-29 ENCOUNTER — Encounter: Payer: Self-pay | Admitting: Internal Medicine
